# Patient Record
Sex: Male | Born: 1945 | Race: White | Hispanic: No | Marital: Married | Smoking: Never smoker
Health system: Southern US, Community
[De-identification: ages and names within clinical notes are randomized; demographics above are authoritative.]

## PROBLEM LIST (undated history)

## (undated) DIAGNOSIS — K219 Gastro-esophageal reflux disease without esophagitis: Secondary | ICD-10-CM

## (undated) DIAGNOSIS — J3089 Other allergic rhinitis: Secondary | ICD-10-CM

## (undated) DIAGNOSIS — T4145XA Adverse effect of unspecified anesthetic, initial encounter: Secondary | ICD-10-CM

## (undated) DIAGNOSIS — R112 Nausea with vomiting, unspecified: Secondary | ICD-10-CM

## (undated) DIAGNOSIS — F329 Major depressive disorder, single episode, unspecified: Secondary | ICD-10-CM

## (undated) DIAGNOSIS — R05 Cough: Secondary | ICD-10-CM

## (undated) DIAGNOSIS — Z87442 Personal history of urinary calculi: Secondary | ICD-10-CM

## (undated) DIAGNOSIS — E785 Hyperlipidemia, unspecified: Secondary | ICD-10-CM

## (undated) DIAGNOSIS — G4730 Sleep apnea, unspecified: Secondary | ICD-10-CM

## (undated) DIAGNOSIS — I459 Conduction disorder, unspecified: Secondary | ICD-10-CM

## (undated) DIAGNOSIS — F32A Depression, unspecified: Secondary | ICD-10-CM

## (undated) DIAGNOSIS — R059 Cough, unspecified: Secondary | ICD-10-CM

## (undated) DIAGNOSIS — K589 Irritable bowel syndrome without diarrhea: Secondary | ICD-10-CM

## (undated) DIAGNOSIS — F419 Anxiety disorder, unspecified: Secondary | ICD-10-CM

## (undated) DIAGNOSIS — M25569 Pain in unspecified knee: Secondary | ICD-10-CM

## (undated) DIAGNOSIS — R053 Chronic cough: Secondary | ICD-10-CM

## (undated) DIAGNOSIS — K851 Biliary acute pancreatitis without necrosis or infection: Secondary | ICD-10-CM

## (undated) DIAGNOSIS — M1990 Unspecified osteoarthritis, unspecified site: Secondary | ICD-10-CM

## (undated) DIAGNOSIS — Z9889 Other specified postprocedural states: Secondary | ICD-10-CM

## (undated) DIAGNOSIS — T8859XA Other complications of anesthesia, initial encounter: Secondary | ICD-10-CM

## (undated) DIAGNOSIS — C801 Malignant (primary) neoplasm, unspecified: Secondary | ICD-10-CM

## (undated) DIAGNOSIS — H9190 Unspecified hearing loss, unspecified ear: Secondary | ICD-10-CM

## (undated) HISTORY — PX: KIDNEY STONE SURGERY: SHX686

## (undated) HISTORY — PX: HERNIA REPAIR: SHX51

## (undated) HISTORY — PX: FOOT SURGERY: SHX648

## (undated) HISTORY — PX: HAMMER TOE SURGERY: SHX385

## (undated) HISTORY — PX: COLONOSCOPY: SHX174

## (undated) HISTORY — PX: KNEE ARTHROSCOPY: SHX127

## (undated) HISTORY — PX: MOHS SURGERY: SUR867

## (undated) HISTORY — PX: CHOLECYSTECTOMY: SHX55

## (undated) HISTORY — PX: EYE SURGERY: SHX253

## (undated) HISTORY — PX: TONSILLECTOMY: SUR1361

---

## 2005-01-11 ENCOUNTER — Ambulatory Visit: Payer: Self-pay | Admitting: General Practice

## 2005-12-19 ENCOUNTER — Ambulatory Visit: Payer: Self-pay | Admitting: General Practice

## 2006-01-04 ENCOUNTER — Ambulatory Visit: Payer: Self-pay | Admitting: General Practice

## 2007-02-13 DIAGNOSIS — K851 Biliary acute pancreatitis without necrosis or infection: Secondary | ICD-10-CM

## 2007-02-13 HISTORY — PX: JOINT REPLACEMENT: SHX530

## 2007-02-13 HISTORY — DX: Biliary acute pancreatitis without necrosis or infection: K85.10

## 2007-07-21 ENCOUNTER — Other Ambulatory Visit: Payer: Self-pay

## 2007-07-21 ENCOUNTER — Ambulatory Visit: Payer: Self-pay | Admitting: General Practice

## 2007-08-04 ENCOUNTER — Inpatient Hospital Stay: Payer: Self-pay | Admitting: General Practice

## 2007-08-12 ENCOUNTER — Other Ambulatory Visit: Payer: Self-pay

## 2007-08-12 ENCOUNTER — Emergency Department: Payer: Self-pay | Admitting: Emergency Medicine

## 2007-09-28 ENCOUNTER — Ambulatory Visit: Payer: Self-pay | Admitting: Family Medicine

## 2007-09-29 ENCOUNTER — Inpatient Hospital Stay: Payer: Self-pay | Admitting: Surgery

## 2007-09-29 ENCOUNTER — Other Ambulatory Visit: Payer: Self-pay

## 2009-04-25 ENCOUNTER — Ambulatory Visit: Payer: Self-pay

## 2009-05-26 ENCOUNTER — Ambulatory Visit: Payer: Self-pay | Admitting: General Practice

## 2009-06-10 ENCOUNTER — Ambulatory Visit: Payer: Self-pay | Admitting: General Practice

## 2010-01-26 ENCOUNTER — Ambulatory Visit: Payer: Self-pay | Admitting: Physician Assistant

## 2010-07-07 ENCOUNTER — Ambulatory Visit: Payer: Self-pay | Admitting: Internal Medicine

## 2011-01-01 ENCOUNTER — Ambulatory Visit: Payer: Self-pay | Admitting: Surgery

## 2011-01-08 ENCOUNTER — Ambulatory Visit: Payer: Self-pay | Admitting: Surgery

## 2011-11-07 ENCOUNTER — Ambulatory Visit: Payer: Self-pay | Admitting: Gastroenterology

## 2012-07-03 ENCOUNTER — Ambulatory Visit: Payer: Self-pay | Admitting: Surgery

## 2012-07-03 LAB — BASIC METABOLIC PANEL
Anion Gap: 5 — ABNORMAL LOW (ref 7–16)
Calcium, Total: 8.9 mg/dL (ref 8.5–10.1)
Co2: 27 mmol/L (ref 21–32)
EGFR (African American): >60
EGFR (Non-African Amer.): >60
Glucose: 103 mg/dL — ABNORMAL HIGH (ref 65–99)
Potassium: 3.7 mmol/L (ref 3.5–5.1)

## 2012-07-03 LAB — CBC WITH DIFFERENTIAL/PLATELET
Basophil #: 0.1 10*3/uL (ref 0.0–0.1)
Basophil %: 1.1 %
Eosinophil #: 0.3 10*3/uL (ref 0.0–0.7)
HCT: 40.8 % (ref 40.0–52.0)
HGB: 14.3 g/dL (ref 13.0–18.0)
Lymphocyte %: 16 %
MCH: 33.7 pg (ref 26.0–34.0)
MCHC: 35 g/dL (ref 32.0–36.0)
Monocyte %: 6.9 %
Neutrophil #: 4.7 10*3/uL (ref 1.4–6.5)
Neutrophil %: 71.6 %
Platelet: 213 10*3/uL (ref 150–440)
RBC: 4.24 10*6/uL — ABNORMAL LOW (ref 4.40–5.90)
RDW: 14 % (ref 11.5–14.5)

## 2012-07-10 ENCOUNTER — Ambulatory Visit: Payer: Self-pay | Admitting: Surgery

## 2012-07-11 LAB — CBC WITH DIFFERENTIAL/PLATELET
Basophil #: 0 10*3/uL (ref 0.0–0.1)
Eosinophil #: 0.3 10*3/uL (ref 0.0–0.7)
HGB: 13.2 g/dL (ref 13.0–18.0)
MCHC: 35.1 g/dL (ref 32.0–36.0)
MCV: 97 fL (ref 80–100)
Neutrophil %: 84 %
RBC: 3.87 10*6/uL — ABNORMAL LOW (ref 4.40–5.90)
RDW: 13.7 % (ref 11.5–14.5)
WBC: 11.6 10*3/uL — ABNORMAL HIGH (ref 3.8–10.6)

## 2012-09-03 ENCOUNTER — Other Ambulatory Visit: Payer: Self-pay | Admitting: Gastroenterology

## 2012-09-10 ENCOUNTER — Other Ambulatory Visit: Payer: Self-pay | Admitting: Gastroenterology

## 2012-09-15 ENCOUNTER — Ambulatory Visit: Payer: Self-pay | Admitting: Internal Medicine

## 2012-09-22 ENCOUNTER — Ambulatory Visit: Payer: Self-pay | Admitting: Gastroenterology

## 2012-11-06 ENCOUNTER — Ambulatory Visit: Payer: Self-pay | Admitting: Gastroenterology

## 2012-11-07 LAB — PATHOLOGY REPORT

## 2013-02-12 HISTORY — PX: HERNIA REPAIR: SHX51

## 2013-05-04 ENCOUNTER — Ambulatory Visit: Payer: Self-pay | Admitting: Urology

## 2013-06-25 DIAGNOSIS — E785 Hyperlipidemia, unspecified: Secondary | ICD-10-CM | POA: Insufficient documentation

## 2013-09-28 IMAGING — US US RENAL KIDNEY
1 series · 14 of 17 positions shown · non-contrast
Comparison: none

REASON FOR EXAM: flank back pain
COMMENTS:

[Series 1: us renal kidney · 0.31mm/px · 14 of 17 slices shown]
[im 1/17]
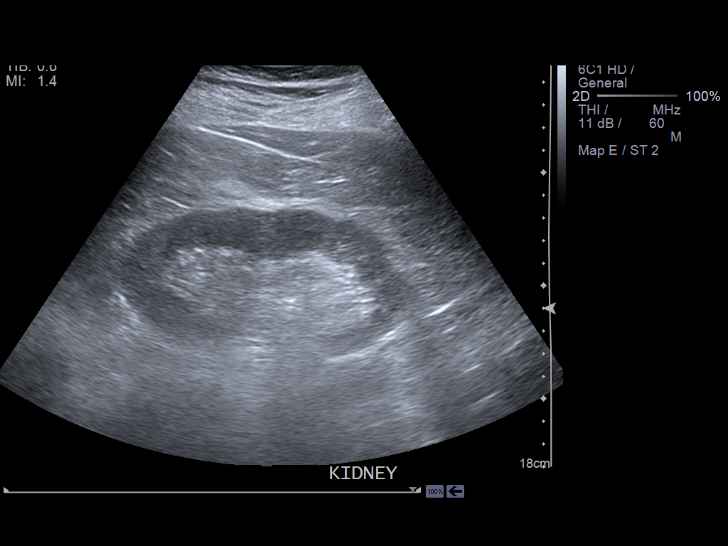
[im 2/17]
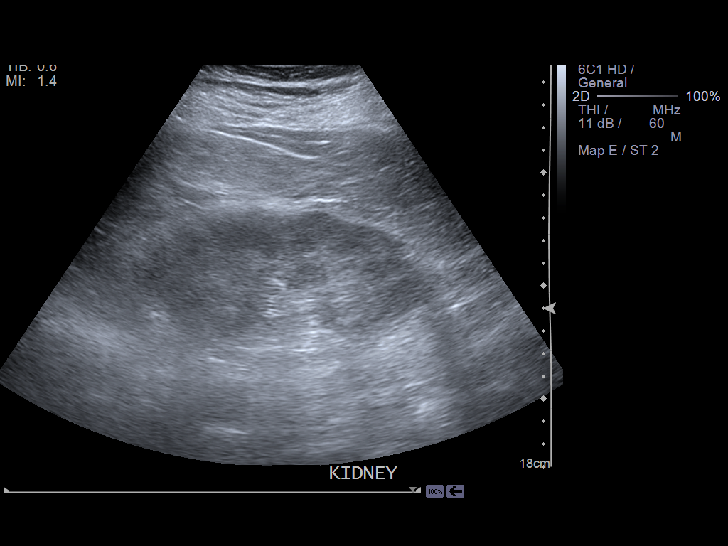
[im 4/17]
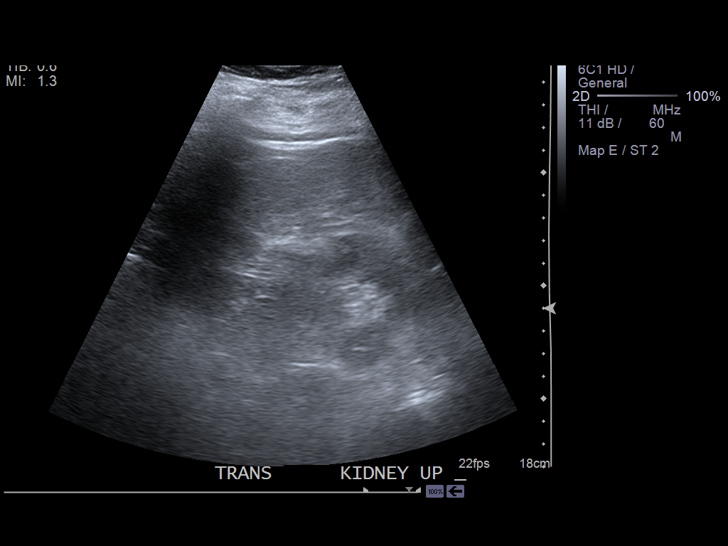
[im 5/17]
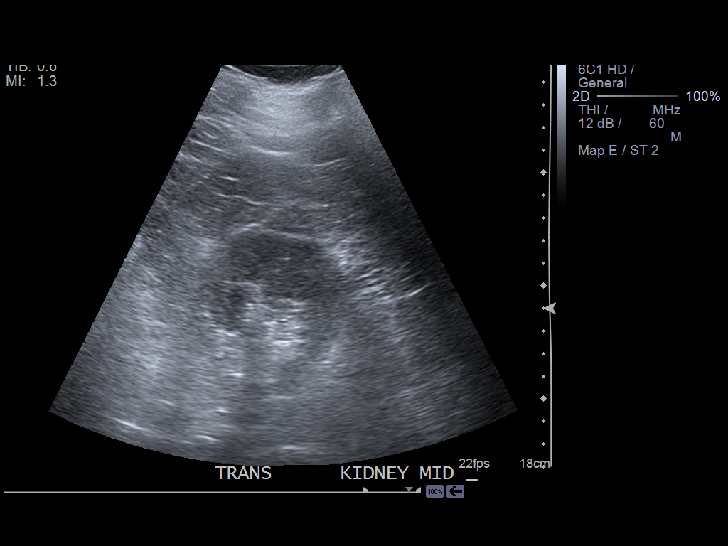
[im 6/17]
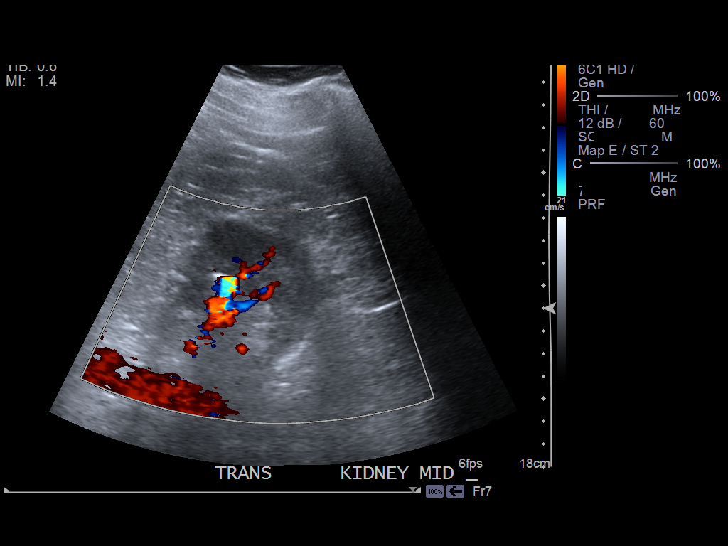
[im 7/17]
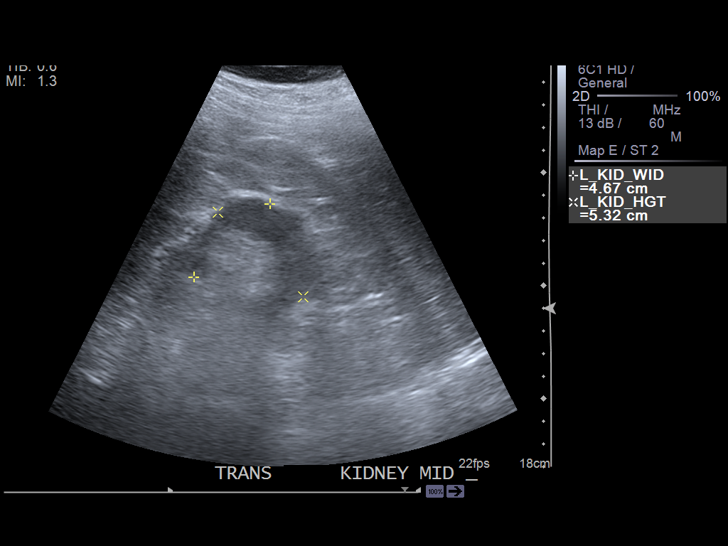
[im 8/17]
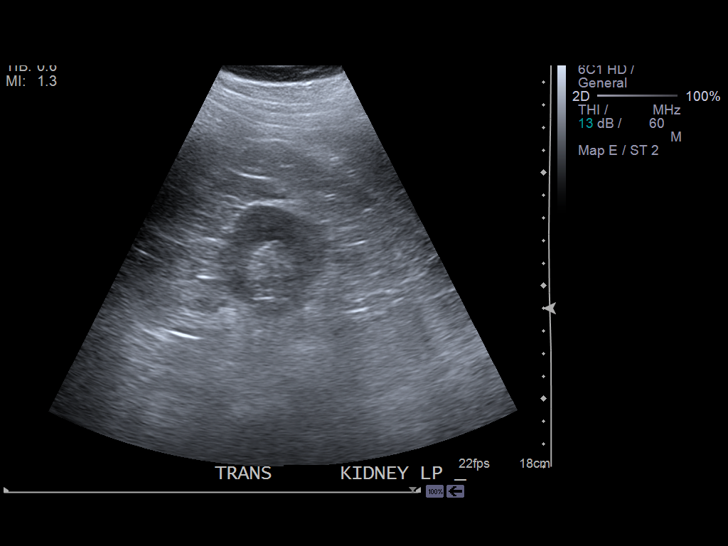
[im 10/17]
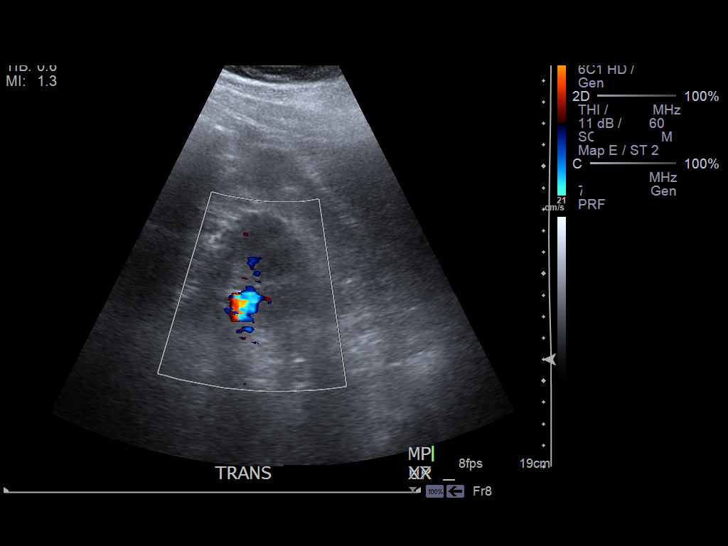
[im 11/17]
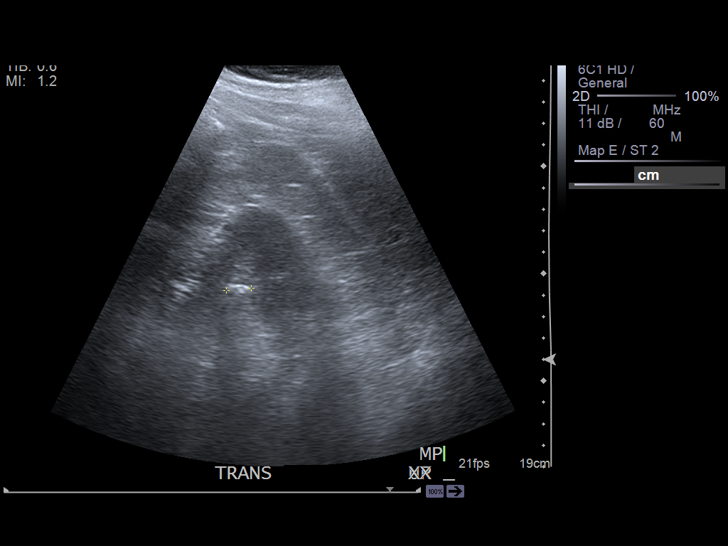
[im 12/17]
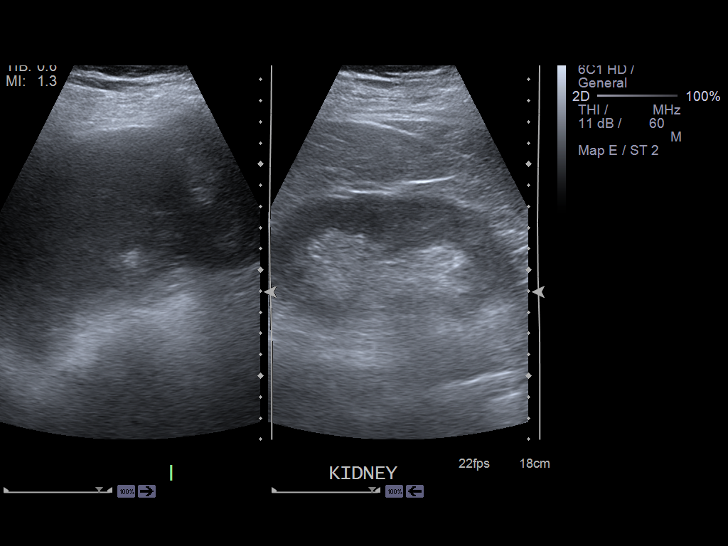
[im 13/17]
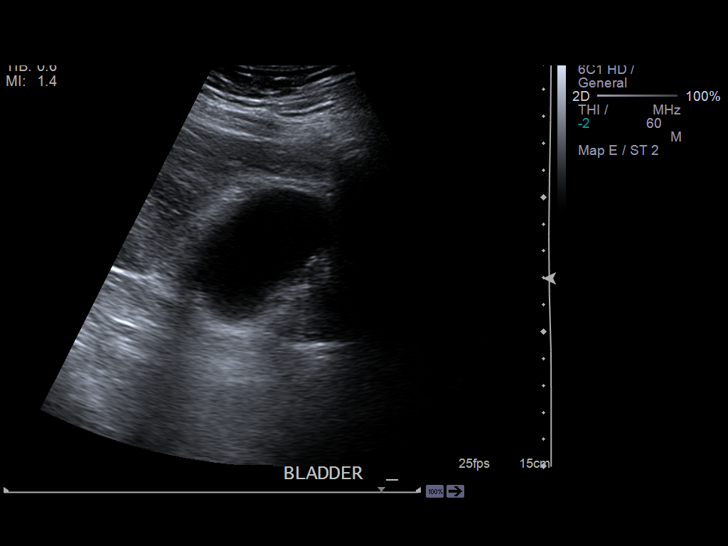
[im 14/17]
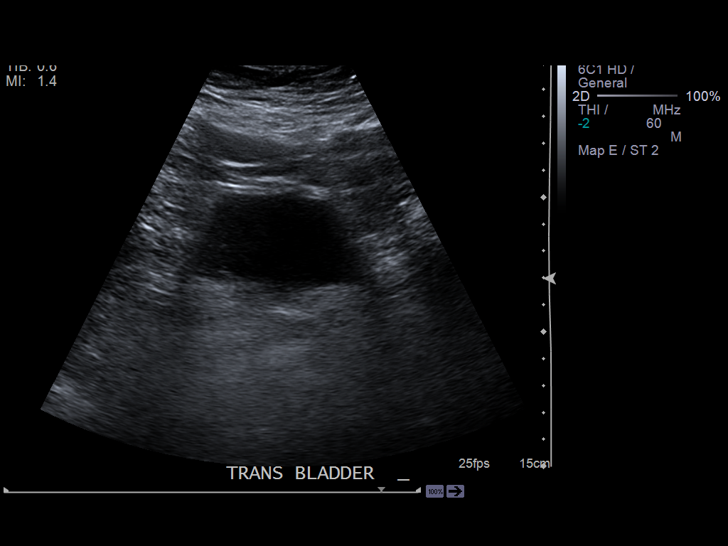
[im 16/17]
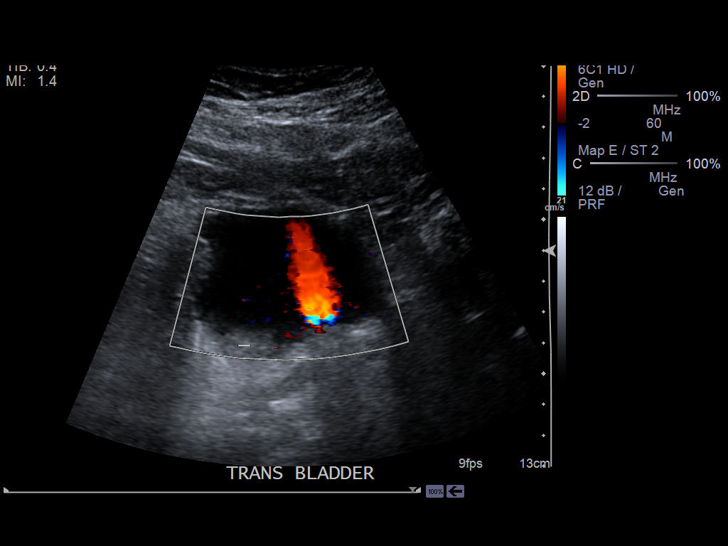
[im 17/17]
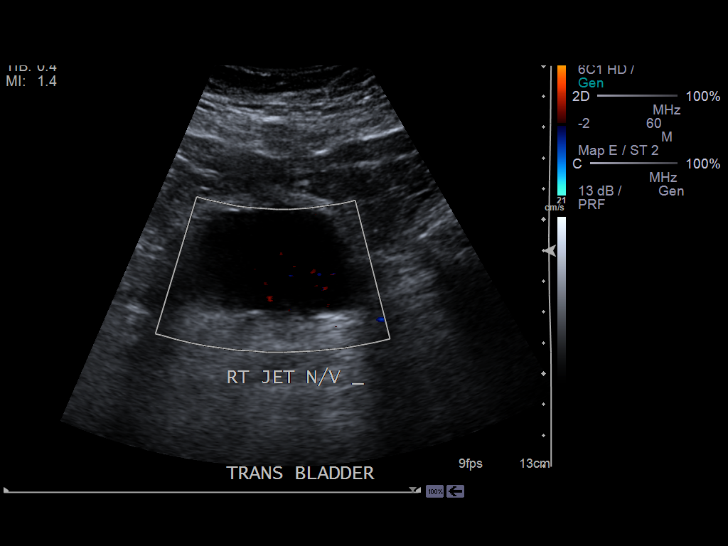

[14 of 17 positions shown; findings below may reference images not displayed]

PROCEDURE:     TYURINA - TYURINA KIDNEYS  - September 15, 2012  [DATE]

RESULT:     Renal sonogram demonstrates the left kidney measures 12.93 x
4.67 x 5.3 to centimeters with a 11.6 mm echogenic shadowing structure in
the mid left kidney consistent with a stone. There is no hydronephrosis or
hilar ureter evident. Left ureteral jet is demonstrated. The right ureteral
jet could not be visualized. There is a small volume urine in the bladder.
The right kidney measures 12.98 x 5.74 x 5.46 cm.
IMPRESSION: Nonobstructing left renal stone. No hydronephrosis. No
solid or cystic masses. The right ureteral jet could not be visualized the
bladder. Correlate clinically.

[REDACTED]

## 2013-09-28 IMAGING — US US EXTREM LOW VENOUS*L*
1 series · 13 of 24 positions shown · non-contrast
Comparison: none

REASON FOR EXAM: calf pain  eval DVT   CALL REPORT  [DATE]
COMMENTS:

[Series 1: us extrem low venous*left* · 0.11mm/px · 13 of 47 slices shown]
[im 1/47]
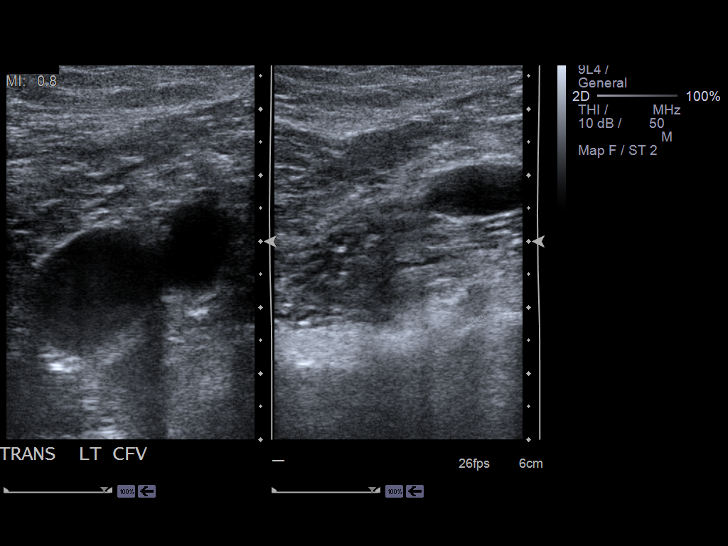
[im 5/47]
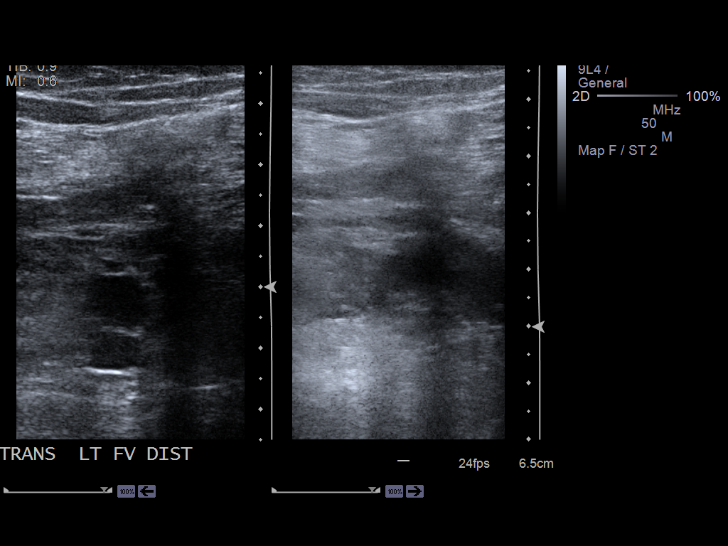
[im 9/47]
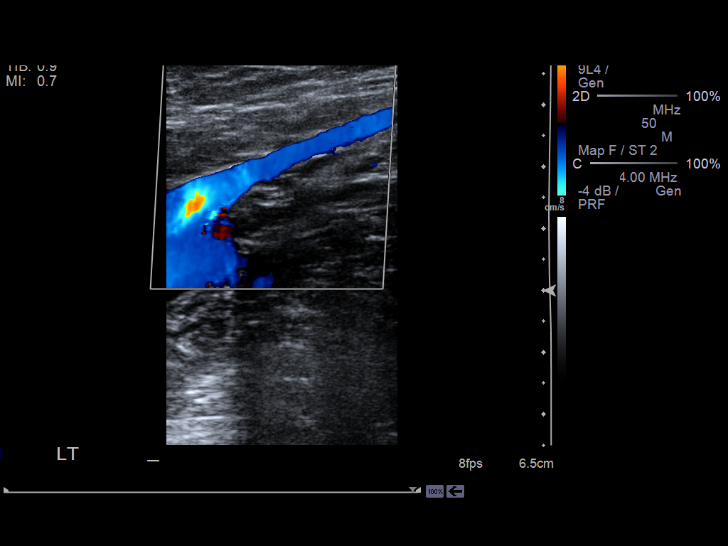
[im 13/47]
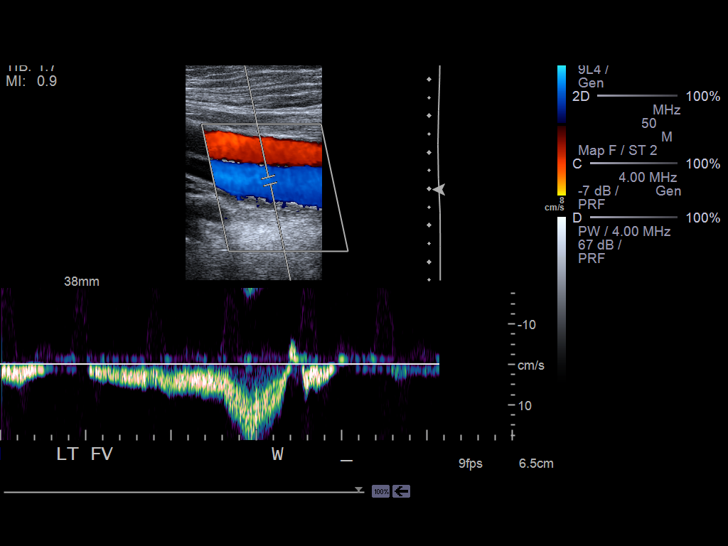
[im 17/47]
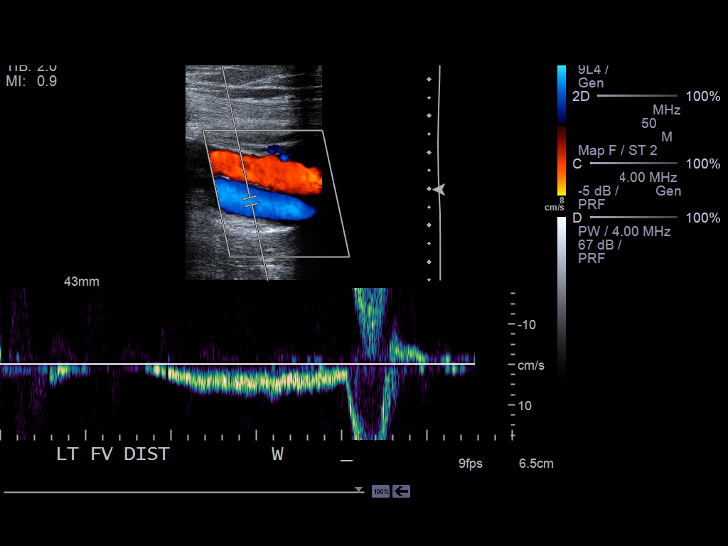
[im 21/47]
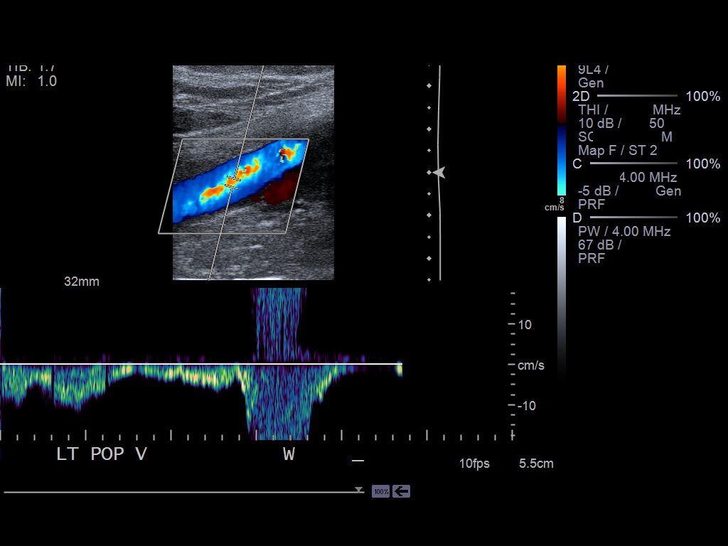
[im 25/47]
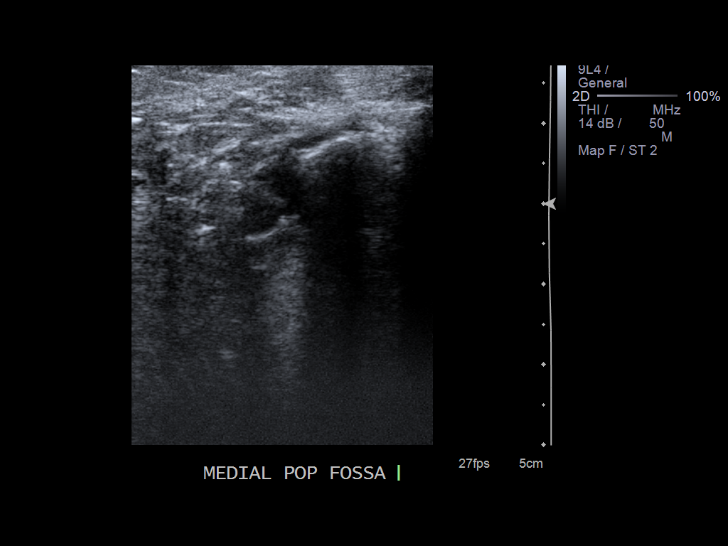
[im 27/47]
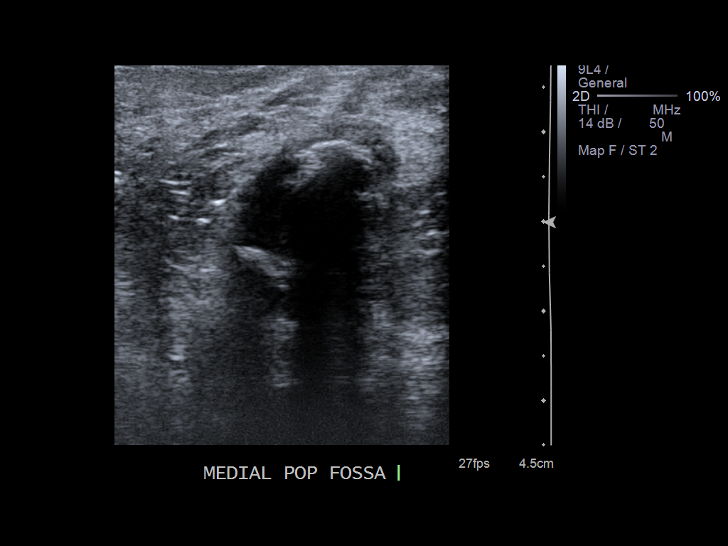
[im 31/47]
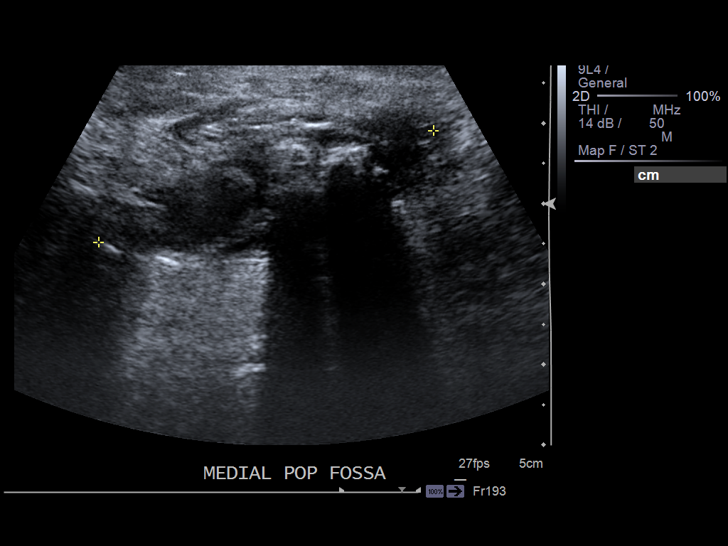
[im 35/47]
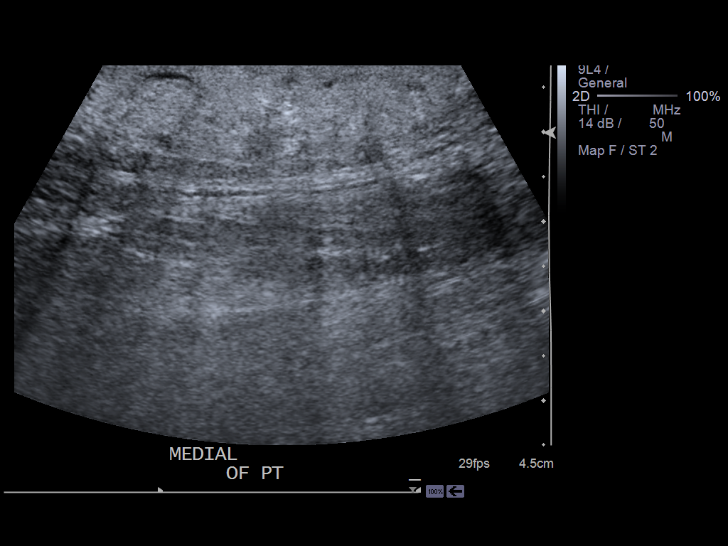
[im 39/47]
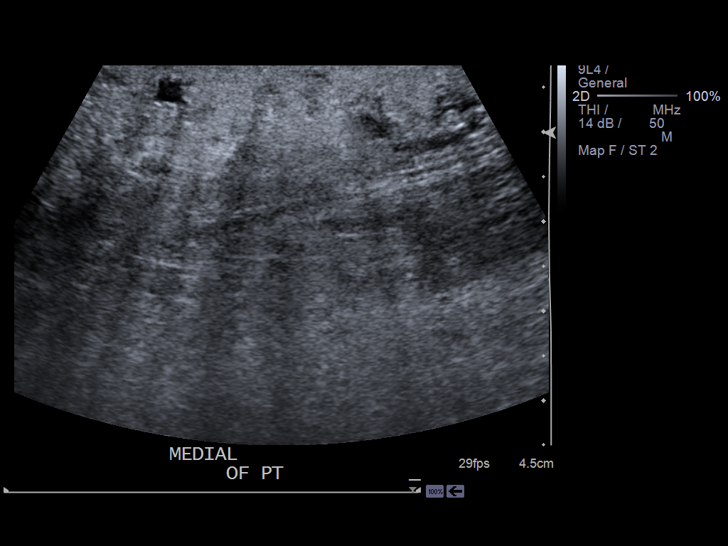
[im 43/47]
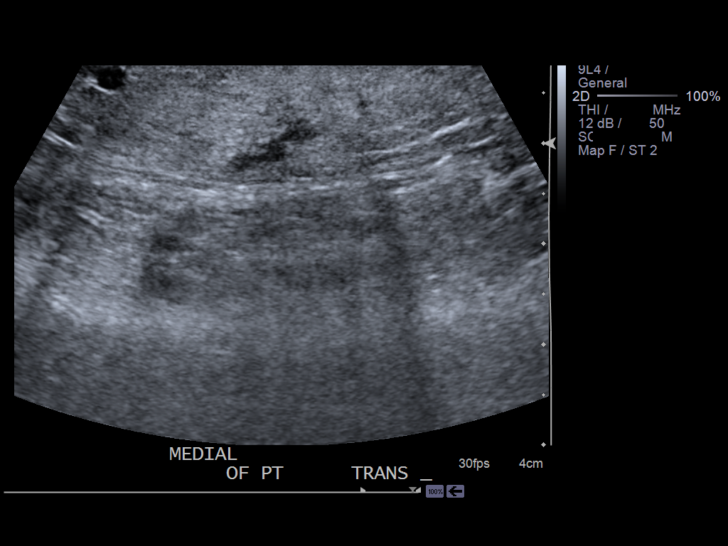
[im 47/47]
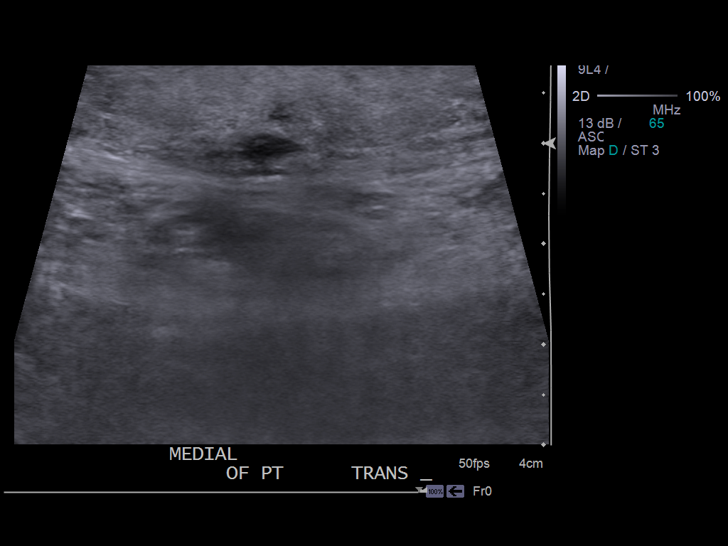

[13 of 24 positions shown; findings below may reference images not displayed]

PROCEDURE:     AMAJUSTE - AMAJUSTE DOPPLER VEINS LT LEG EXTR  - September 15, 2012  [DATE]

RESULT:     Carotid Doppler interrogation of the deep venous system of the
left leg is performed from the common femoral vein through the popliteal
vein. The patient has no previous exam for comparison. There is complete
compressibility of the deep venous structures on the grayscale compression
imaging portion of the exam. The color Doppler and spectral Doppler
appearance is normal. There appears to be an irregularly marginated small
popliteal fossa cyst measuring 2.16 x 1.04 x 4.39 cm. The area of the
patient's palpable abnormality demonstrates a heterogeneous echogenic tissue
appearance superficially which could represent a lipoma. The hematoma is
felt to be less likely.
IMPRESSION: 1. No evidence of left lower extremity deep vein thrombosis.
2. Small popliteal fossa cyst.
3. Uncertain increased echotexture at the site of the patient's palpable
abnormality measuring up to 5.16 x 3.24 cm. This could represent a somewhat
heterogeneous appearance of a lipoma. Correlate clinically.

[REDACTED]

## 2013-09-30 ENCOUNTER — Ambulatory Visit: Payer: Self-pay | Admitting: Internal Medicine

## 2013-10-13 ENCOUNTER — Ambulatory Visit: Payer: Self-pay | Admitting: Urology

## 2013-10-22 ENCOUNTER — Ambulatory Visit: Payer: Self-pay | Admitting: Urology

## 2014-03-15 ENCOUNTER — Ambulatory Visit: Payer: Self-pay | Admitting: Urology

## 2014-03-15 LAB — CBC WITH DIFFERENTIAL/PLATELET
BASOS ABS: 0.1 10*3/uL (ref 0.0–0.1)
BASOS PCT: 1.3 %
EOS ABS: 0.2 10*3/uL (ref 0.0–0.7)
Eosinophil %: 2.7 %
HCT: 42.5 % (ref 40.0–52.0)
HGB: 14.4 g/dL (ref 13.0–18.0)
LYMPHS PCT: 22.3 %
Lymphocyte #: 1.7 10*3/uL (ref 1.0–3.6)
MCH: 32.4 pg (ref 26.0–34.0)
MCHC: 33.9 g/dL (ref 32.0–36.0)
MCV: 95 fL (ref 80–100)
MONO ABS: 0.6 x10 3/mm (ref 0.2–1.0)
Monocyte %: 8.4 %
Neutrophil #: 5 10*3/uL (ref 1.4–6.5)
Neutrophil %: 65.3 %
PLATELETS: 239 10*3/uL (ref 150–440)
RBC: 4.46 10*6/uL (ref 4.40–5.90)
RDW: 13.4 % (ref 11.5–14.5)
WBC: 7.6 10*3/uL (ref 3.8–10.6)

## 2014-03-22 ENCOUNTER — Ambulatory Visit: Payer: Self-pay | Admitting: Urology

## 2014-04-29 ENCOUNTER — Emergency Department: Payer: Self-pay | Admitting: Emergency Medicine

## 2014-05-03 ENCOUNTER — Ambulatory Visit: Payer: Self-pay | Admitting: Urology

## 2014-06-04 NOTE — Op Note (Signed)
PATIENT NAME:  Travis Mills, Travis Mills MR#:  [id number] DATE OF BIRTH:  10/15/1945  DATE OF PROCEDURE:  07/10/2012  PREOPERATIVE DIAGNOSIS:  Ventral hernia.   POSTOPERATIVE DIAGNOSIS:  Ventral hernia.   OPERATION:  Ventral hernia repair.   ANESTHESIA:  General.   SURGEON:  Rodena Goldmann, M.D.   OPERATIVE PROCEDURE:  With the patient in the supine position after induction of appropriate general anesthesia, patient's abdomen was prepped with ChloraPrep and draped with sterile towels.  Alcohol wipe, Betadine impregnated Steri-Drape were utilized.  The original plan was a laparoscopic robotically-assisted ventral hernia repair.  A 5 mm left upper quadrant Optiview incision was made with the 5 mm scope in the abdominal cavity without difficulty.  An assistant port 12 mm size was placed laterally and inferiorly an 8 mm port was placed.  The upper port was then switched for an 8 mm port under direct vision.  The robot was brought to the table and docked.  The abdomen was easily insufflated without difficulty.  The hernia examined.  A small adhesion noted at the defect.  The defect was quite small.  However, during the insufflation and pneumoperitoneum the patient developed sudden onset of complete heart block.  We desufflated the abdomen.  His heart returned with medical support.  Putting him back at lower levels of pneumoperitoneum did not allow for adequate visualization with the robot of the midline hernia.  I elected to abandon the robotic approach and move to an open repair despite the fact the defect was quite small.  Midline incision was made in the area of the hernia, carried down through the subcutaneous tissue with Bovie electrocautery.  The hernia defect was identified and opened into the abdomen.  The adhesion to the anterior abdominal wall was lysed.  The defect was enlarged, allowed enough to accommodate a piece of circular Ventralex which was sutured in place with 0 Surgilon.  The wings were trimmed.   The fascial defect was closed with horizontal mattress sutures of 0 Maxon.  The area was infiltrated with 0.25% Marcaine for postoperative pain control.  Skin was clipped as were the lateral incisions and the patient awakened after a sterile dressing was placed.  The patient had no further episodes of complete heart block.  The patient was taken to the recovery room in satisfactory condition.     ____________________________ Micheline Maze, MD rle:ea D: 07/10/2012 14:35:15 ET T: 07/11/2012 00:05:09 ET JOB#: [id number]  cc: Micheline Maze, MD, <Dictator> Isaias Cowman, MD Leonie Douglas. Doy Hutching, MD Rodena Goldmann MD ELECTRONICALLY SIGNED 07/12/2012 9:45

## 2014-06-04 NOTE — Discharge Summary (Signed)
PATIENT NAME:  Travis Mills, Travis Mills MR#:  [id number] DATE OF BIRTH:  03-27-1945  DATE OF ADMISSION:  07/10/2012 DATE OF DISCHARGE:  07/11/2012  BRIEF HISTORY:  The patient is a 69 year old gentleman admitted for an elective surgical procedure on his small midline ventral hernia. We had planned robotic-assisted ventral hernia repair. At the time of surgery he was stable, intubated and the ports placed without difficulty. Upon insufflating his abdomen, he developed complete heart block with escape rates in the 30s. The abdomen was desufflated and his rhythm returned to normal some pharmacologic support. We then elected to abandon the laparoscopic approach and moved to an open procedure which was much shorter and it was accomplished without difficulty. He was admitted for observation using telemetry and having Dr. Miquel Dunn of the Cardiology service assist in his management. He has done well over the course of the evening. He has no significant problems. His wounds look good. There is no sign of any infection. He has got some moderate drainage. His heart rhythms remained stable over the course of the evening. Dr. Saralyn Pilar does not feel any further workup for his heart is indicated, he has had no further dysrhythmia. We will discharge him today to be followed in the office in 7 to 10 days' time. Bathing, activity, and driving instructions were given to the patient. He is to resume his medication including aspirin 81 mg p.o. daily, pravastatin 20 mg p.o. at bedtime, lithium 300 mg p.o. q.a.m. and 750 mg p.o. at bedtime, sertraline 100 mg at bedtime, Cialis 5 mg once a day, Nexium 40 mg once a day and Vicodin 5/325 every 6 hours p.r.n. pain.   FINAL DISCHARGE DIAGNOSIS:  Ventral hernia, complete heart block.   SURGERY:  Ventral hernia repair.   ____________________________ Micheline Maze, MD rle:jm D: 07/11/2012 09:25:28 ET T: 07/11/2012 11:43:18 ET JOB#: [id number]  cc: Micheline Maze, MD,  <Dictator> Isaias Cowman, MD Leonie Douglas. Doy Hutching, MD Rodena Goldmann MD ELECTRONICALLY SIGNED 07/12/2012 9:45

## 2014-06-04 NOTE — Consult Note (Signed)
PATIENT NAME:  Travis Mills, Travis Mills MR#:  [id number] DATE OF BIRTH:  1945-02-16  DATE OF CONSULTATION:  07/10/2012  CONSULTING PHYSICIAN:  Isaias Cowman, MD  PRIMARY CARE PHYSICIAN:  Dr. Doy Hutching  REFERRING PHYSICIAN:  Dr. Pat Patrick   REASON FOR CONSULTATION: Bradycardia.   HISTORY OF PRESENT ILLNESS: The patient is a 69 year old gentleman with history of hypertension, hyperlipidemia and sleep apnea. The patient underwent robotic-assisted laparoscopic incisional hernia repair today. During CO2 insufflation, the patient experienced bradycardia. Telemetry revealed probable junctional bradycardia, with a rate in the 40s. When the patient was decompressed, bradycardia resolved. The patient remained clinically stable in recovery, and has remained in sinus rhythm, without evidence of bradycardia. The patient denies prior history of bradycardia. He has had previous cardiac catheterization, which revealed insignificant coronary artery disease, and currently denies any chest pain, shortness of breath, presyncope or syncope.   PAST MEDICAL HISTORY: 1.  Insignificant coronary artery disease by cardiac catheterization in 1997.  2.  Hypertension.  3.  Hyperlipidemia.  4.  Sleep apnea. 5.  Erectile dysfunction.   MEDICATIONS:  Pravastatin 20 mg daily, Cialis, lithium carbonate, Nexium, Norco and sertraline.   SOCIAL HISTORY:  The patient is married. Denies tobacco abuse.   FAMILY HISTORY: No immediate family history for coronary artery disease or myocardial infarction.   REVIEW OF SYSTEMS:   CONSTITUTIONAL: No fever or chills.  EYES:  No blurry vision.  EARS:  No hearing loss.  RESPIRATORY:  No shortness of breath.  CARDIOVASCULAR:  No chest pain.  GASTROINTESTINAL: The patient has had some recent diarrhea.  GENITOURINARY: No dysuria or hematuria.  ENDOCRINE:  No polyuria or polydipsia.  MUSCULOSKELETAL: No arthralgias or myalgias.  NEUROLOGICAL:  No focal muscle weakness or numbness.  PSYCHOLOGICAL:  No depression or anxiety.   PHYSICAL EXAMINATION: VITAL SIGNS:  Blood pressure 98/63, pulse 75, respirations 18, temperature 97, pulse ox 92%.  HEENT:  Pupils equal and reactive to light and accommodation.  NECK:  Supple, without thyromegaly.  LUNGS:  Clear.  CARDIOVASCULAR:  Normal JVP. Normal PMI. Regular rate and rhythm. Normal S1, S2. No appreciable gallop, murmur or rub.  ABDOMEN: Soft, nontender. Pulses were intact bilaterally.  MUSCULOSKELETAL: Normal muscle tone.  NEUROLOGIC: The patient is alert and oriented x 3. Motor and sensory both grossly intact.   IMPRESSION: A 69 year old gentleman with brief bradycardia during laparoscopic incisional hernia repair today, with resolution. The patient currently is asymptomatic.   RECOMMENDATIONS: 1.  Agree with overall current therapy.  2.  Continue to monitor telemetry. 3. If patient does well overnight, may discharge home without further cardiac evaluation at this time.  4.  Would defer temporary or permanent pacemaker implantation at this time.     ____________________________ Isaias Cowman, MD ap:mr D: 07/10/2012 18:57:00 ET T: 07/10/2012 19:50:54 ET JOB#: [id number]  cc: Isaias Cowman, MD, <Dictator> Isaias Cowman MD ELECTRONICALLY SIGNED 07/21/2012 8:48

## 2014-06-13 NOTE — Op Note (Signed)
PATIENT NAME:  Travis Mills, Travis Mills MR#:  [id number] DATE OF BIRTH:  07/22/1945  DATE OF PROCEDURE:  03/22/2014  PREOPERATIVE DIAGNOSIS: Left 5 mm kidney stone.  POSTOPERATIVE DIAGNOSIS: Left 5 mm kidney stone.  PROCEDURE PERFORMED: Left ureteroscopy, laser lithotripsy, left ureteral stent placement.   ATTENDING SURGEON: Sherlynn Stalls, MD  ANESTHESIA: General anesthesia.  ESTIMATED BLOOD LOSS: Minimal.   DRAINS: A 6 x 26-French double-J ureteral stent on the left.   COMPLICATIONS: None.   SPECIMENS: None.   INDICATION: This is a 69 year old male with a history of kidney stones who has persistent left flank pain since September 2015. He did have a CT scan at [facility] around that time which demonstrates a 5 mm stone within the renal pelvis which presumably has been ball valving, causing his flank discomfort. He reports that he has not passed the stone and would like to have it treated. We discussed various treatment options and he elected to undergo left ureteroscopy, laser lithotripsy, left ureteral stent placement.   PROCEDURE: The patient was correctly identified in the preoperative holding area and informed consent was confirmed. He was brought to the operating suite and placed on the table in the supine position. At this time, universal timeout protocol was performed. All team members were identified. Venodyne boots were placed and he was administered 500 mg of IV Levaquin in the perioperative period. He was then placed under general anesthesia, repositioned lower on the bed in the dorsal lithotomy position, and prepped and draped in the standard surgical fashion. At this point in time, a rigid 22-French cystoscope was advanced per urethra into the bladder and attention was turned to the left ureteral orifice. This was cannulated using a 5-French open-ended ureteral catheter just within the UO and a retrograde pyelogram was performed. This revealed a delicate-appearing ureter without any filling  defects and no evidence of hydronephrosis. Prior to the retrograde, there was a small punctate calcification noted overlying the renal shadow consistent with his stone seen on CT scan. At this point in time, an 0.038 Sensor wire was advanced up to the level of the kidney and the cystoscope was removed. A dual-lumen catheter was then used to introduce a second wire up to the level of the renal pelvis without difficulty. One was used as a working wire and the other as a Chiropodist. The safety wire snapped in place. I then attempted to advance a 7-French flexible ureteroscope over the working wires to the level of the the renal pelvis but was unsuccessful, meeting resistance in the distal one-third of the ureter with some buckling of the scope. I then decided to place an access sheath to gently dilate the ureter first using the inner cannula of a 12/14-French access sheath, which did pass up to the level of the mid proximal ureter without difficulty. Both the inner and outer sheaths were then used up to the same level and the inner sheath was removed, as well as the working wire. The flexible ureteroscope was then advanced through this up to the level of the renal pelvis. An approximately 7 mm stone was identified in a mid pole calyx. A 273 micron laser fiber was then brought in using the settings of 0.2 joules and 50 Hz. The stone was fragmented into innumerable tiny pieces approximately the size of the laser fiber. These pieces at the end of the fragmentation were so small that they were unable to be basketed out effectively. A retrograde pyelogram was then performed through the  scope at the level of the UPJ, creating a map of the renal pelvis and calyces each individual calyx. Each individual calyx was then directly visualized to ensure that there were no residual significant stone fragments. This was negative. I then backed the ureteroscope down the ureter under direct visualization, removing the access sheath at  the same time, to ensure that there were no ureteral defects, stones, or concerns for perforation. The ureter also appeared to be completely intact. The safety wire was then backloaded over a rigid cystoscope and a 6 x 26-French double-J ureteral stent was placed over the wire up to the level of the kidney. The wire was partially withdrawn and a partial coil was noted. The wire was then fully withdrawn and a full coil was noted within the bladder. The bladder was then drained. The patient was then cleaned and dried and repositioned in the supine position, reversed from anesthesia, and taken to the PACU in stable condition. There were complications in this case.   ____________________________ Sherlynn Stalls, MD ajb:ST D: 03/22/2014 17:32:13 ET T: 03/23/2014 00:51:09 ET JOB#: [id number]  cc: Sherlynn Stalls, MD, <Dictator> Sherlynn Stalls MD ELECTRONICALLY SIGNED 04/06/2014 12:36

## 2014-09-24 ENCOUNTER — Other Ambulatory Visit: Payer: Self-pay | Admitting: Internal Medicine

## 2014-09-24 DIAGNOSIS — M79661 Pain in right lower leg: Secondary | ICD-10-CM

## 2014-09-27 ENCOUNTER — Other Ambulatory Visit: Payer: Self-pay

## 2014-09-27 DIAGNOSIS — N200 Calculus of kidney: Secondary | ICD-10-CM

## 2014-09-30 ENCOUNTER — Ambulatory Visit
Admission: RE | Admit: 2014-09-30 | Discharge: 2014-09-30 | Disposition: A | Discharge status: Home / Self Care | Payer: Medicare Other | Source: Ambulatory Visit | Attending: Internal Medicine | Admitting: Internal Medicine

## 2014-09-30 DIAGNOSIS — M79661 Pain in right lower leg: Secondary | ICD-10-CM | POA: Diagnosis not present

## 2015-05-04 ENCOUNTER — Telehealth: Payer: Self-pay | Admitting: Radiology

## 2015-05-04 NOTE — Telephone Encounter (Signed)
Pt called to cancel his appt on 4/5 with Larene Beach because he is having knee replacement surgery.  He states he will call back to r/s after he recuperates.

## 2015-05-18 ENCOUNTER — Ambulatory Visit: Payer: Self-pay | Admitting: Urology

## 2015-05-27 ENCOUNTER — Other Ambulatory Visit: Payer: Self-pay | Admitting: Orthopedic Surgery

## 2015-05-30 ENCOUNTER — Other Ambulatory Visit: Payer: Self-pay | Admitting: Orthopedic Surgery

## 2015-05-30 ENCOUNTER — Encounter
Admission: RE | Admit: 2015-05-30 | Discharge: 2015-05-30 | Disposition: A | Discharge status: Home / Self Care | Payer: Medicare Other | Source: Ambulatory Visit | Attending: Orthopedic Surgery | Admitting: Orthopedic Surgery

## 2015-05-30 DIAGNOSIS — Z01810 Encounter for preprocedural cardiovascular examination: Secondary | ICD-10-CM | POA: Diagnosis present

## 2015-05-30 DIAGNOSIS — I10 Essential (primary) hypertension: Secondary | ICD-10-CM | POA: Diagnosis not present

## 2015-05-30 DIAGNOSIS — Z01812 Encounter for preprocedural laboratory examination: Secondary | ICD-10-CM | POA: Diagnosis not present

## 2015-05-30 DIAGNOSIS — E785 Hyperlipidemia, unspecified: Secondary | ICD-10-CM | POA: Insufficient documentation

## 2015-05-30 HISTORY — DX: Nausea with vomiting, unspecified: R11.2

## 2015-05-30 HISTORY — DX: Malignant (primary) neoplasm, unspecified: C80.1

## 2015-05-30 HISTORY — DX: Adverse effect of unspecified anesthetic, initial encounter: T41.45XA

## 2015-05-30 HISTORY — DX: Hyperlipidemia, unspecified: E78.5

## 2015-05-30 HISTORY — DX: Gastro-esophageal reflux disease without esophagitis: K21.9

## 2015-05-30 HISTORY — DX: Anxiety disorder, unspecified: F41.9

## 2015-05-30 HISTORY — DX: Other specified postprocedural states: Z98.890

## 2015-05-30 HISTORY — DX: Conduction disorder, unspecified: I45.9

## 2015-05-30 HISTORY — DX: Irritable bowel syndrome without diarrhea: K58.9

## 2015-05-30 HISTORY — DX: Other complications of anesthesia, initial encounter: T88.59XA

## 2015-05-30 LAB — SURGICAL PCR SCREEN
MRSA, PCR: NEGATIVE
Staphylococcus aureus: NEGATIVE

## 2015-05-30 LAB — BASIC METABOLIC PANEL
Anion gap: 3 — ABNORMAL LOW (ref 5–15)
BUN: 21 mg/dL — ABNORMAL HIGH (ref 6–20)
CHLORIDE: 110 mmol/L (ref 101–111)
CO2: 25 mmol/L (ref 22–32)
CREATININE: 0.73 mg/dL (ref 0.61–1.24)
Calcium: 9.1 mg/dL (ref 8.9–10.3)
GFR calc non Af Amer: >60 mL/min (ref >60)
Glucose, Bld: 95 mg/dL (ref 65–99)
Potassium: 3.9 mmol/L (ref 3.5–5.1)
SODIUM: 138 mmol/L (ref 135–145)

## 2015-05-30 LAB — URINALYSIS COMPLETE WITH MICROSCOPIC (ARMC ONLY)
BILIRUBIN URINE: NEGATIVE
Bacteria, UA: NONE SEEN
GLUCOSE, UA: NEGATIVE mg/dL
Hgb urine dipstick: NEGATIVE
KETONES UR: NEGATIVE mg/dL
Leukocytes, UA: NEGATIVE
NITRITE: NEGATIVE
PROTEIN: NEGATIVE mg/dL
SPECIFIC GRAVITY, URINE: 1.019 (ref 1.005–1.030)
Squamous Epithelial / LPF: NONE SEEN
pH: 7 (ref 5.0–8.0)

## 2015-05-30 LAB — CBC
HEMATOCRIT: 42.2 % (ref 40.0–52.0)
Hemoglobin: 14.6 g/dL (ref 13.0–18.0)
MCH: 33.1 pg (ref 26.0–34.0)
MCHC: 34.6 g/dL (ref 32.0–36.0)
MCV: 95.7 fL (ref 80.0–100.0)
PLATELETS: 210 10*3/uL (ref 150–440)
RBC: 4.41 MIL/uL (ref 4.40–5.90)
RDW: 13.6 % (ref 11.5–14.5)
WBC: 6.8 10*3/uL (ref 3.8–10.6)

## 2015-05-30 LAB — PROTIME-INR
INR: 1.03
PROTHROMBIN TIME: 13.7 s (ref 11.4–15.0)

## 2015-05-30 LAB — SEDIMENTATION RATE: Sed Rate: 7 mm/hr (ref 0–20)

## 2015-05-30 LAB — APTT: APTT: 26 s (ref 24–36)

## 2015-05-30 NOTE — Patient Instructions (Signed)
Your procedure is scheduled on: Wednesday 06/15/15 Report to Day Surgery. 2ND FLOOR MEDICAL MALL ENTRANCE To find out your arrival time please call [phone] between 1PM - 3PM on Tuesday 06/14/15.  Remember: Instructions that are not followed completely may result in serious medical risk, up to and including death, or upon the discretion of your surgeon and anesthesiologist your surgery may need to be rescheduled.    __X__ 1. Do not eat food or drink liquids after midnight. No gum chewing or hard candies.     __X__ 2. No Alcohol for 24 hours before or after surgery.   ____ 3. Bring all medications with you on the day of surgery if instructed.    __X__ 4. Notify your doctor if there is any change in your medical condition     (cold, fever, infections).     Do not wear jewelry, make-up, hairpins, clips or nail polish.  Do not wear lotions, powders, or perfumes.   Do not shave 48 hours prior to surgery. Men may shave face and neck.  Do not bring valuables to the hospital.    [facility] is not responsible for any belongings or valuables.               Contacts, dentures or bridgework may not be worn into surgery.  Leave your suitcase in the car. After surgery it may be brought to your room.  For patients admitted to the hospital, discharge time is determined by your                treatment team.   Patients discharged the day of surgery will not be allowed to drive home.   Please read over the following fact sheets that you were given:   MRSA Information and Surgical Site Infection Prevention   __X__ Take these medicines the morning of surgery with A SIP OF WATER:    1. WELCHOL  2.   3.   4.  5.  6.  ____ Fleet Enema (as directed)   __X__ Use CHG Soap as directed  ____ Use inhalers on the day of surgery  ____ Stop metformin 2 days prior to surgery    ____ Take 1/2 of usual insulin dose the night before surgery and none on the morning of surgery.   __X__ Stop  Coumadin/Plavix/aspirin on 10 DAYS PRIOR TO SURGERY  __X__ Stop Anti-inflammatories on 7 DAYS PRIOR TO SURGERY (IBUPROFEN) YOU MAY TAKE TYLENOL FOR ACHES OR PAINS   ____ Stop supplements until after surgery.    __X__ Bring C-Pap to the hospital.

## 2015-06-01 LAB — URINE CULTURE: Culture: 1000 — AB

## 2015-06-02 NOTE — Pre-Procedure Instructions (Signed)
FAXED URINE AND URINE CULTURE RESULTS TO DR Marry Guan OFFICE

## 2015-06-02 NOTE — Pre-Procedure Instructions (Addendum)
FAX RETURNED FROM DR HOOTEN OFFICE WITH ORDERS VO: NO TREATMENT NECESSARY (urine culture results) ERUSSELL RN/DR HOOTEN

## 2015-06-03 MED ORDER — TRANEXAMIC ACID 1000 MG/10ML IV SOLN: 1000.0000 mg | INTRAVENOUS | Status: DC

## 2015-06-07 ENCOUNTER — Encounter
Admission: RE | Admit: 2015-06-07 | Discharge: 2015-06-07 | Disposition: A | Discharge status: Home / Self Care | Payer: Medicare Other | Source: Ambulatory Visit | Attending: Orthopedic Surgery | Admitting: Orthopedic Surgery

## 2015-06-07 DIAGNOSIS — Z01812 Encounter for preprocedural laboratory examination: Secondary | ICD-10-CM | POA: Diagnosis not present

## 2015-06-07 LAB — TYPE AND SCREEN
ABO/RH(D): A POS
ANTIBODY SCREEN: NEGATIVE

## 2015-06-07 LAB — ABO/RH: ABO/RH(D): A POS

## 2015-06-09 NOTE — Pre-Procedure Instructions (Signed)
Office notified and faxed positive staph results.

## 2015-06-15 ENCOUNTER — Inpatient Hospital Stay
Admission: RE | Admit: 2015-06-15 | Discharge: 2015-06-17 | DRG: 470 | Disposition: A | Discharge status: Home Health | Payer: Medicare Other | Source: Ambulatory Visit | Attending: Orthopedic Surgery | Admitting: Orthopedic Surgery

## 2015-06-15 ENCOUNTER — Encounter: Payer: Self-pay | Admitting: *Deleted

## 2015-06-15 ENCOUNTER — Inpatient Hospital Stay: Payer: Medicare Other | Admitting: Anesthesiology

## 2015-06-15 ENCOUNTER — Inpatient Hospital Stay: Payer: Medicare Other

## 2015-06-15 ENCOUNTER — Encounter
Admission: RE | Disposition: A | Discharge status: Home Health | Payer: Self-pay | Source: Ambulatory Visit | Attending: Orthopedic Surgery

## 2015-06-15 DIAGNOSIS — E785 Hyperlipidemia, unspecified: Secondary | ICD-10-CM | POA: Diagnosis present

## 2015-06-15 DIAGNOSIS — Z7982 Long term (current) use of aspirin: Secondary | ICD-10-CM | POA: Diagnosis not present

## 2015-06-15 DIAGNOSIS — Z96659 Presence of unspecified artificial knee joint: Secondary | ICD-10-CM

## 2015-06-15 DIAGNOSIS — Z85828 Personal history of other malignant neoplasm of skin: Secondary | ICD-10-CM

## 2015-06-15 DIAGNOSIS — K219 Gastro-esophageal reflux disease without esophagitis: Secondary | ICD-10-CM | POA: Diagnosis present

## 2015-06-15 DIAGNOSIS — F419 Anxiety disorder, unspecified: Secondary | ICD-10-CM | POA: Diagnosis present

## 2015-06-15 DIAGNOSIS — K589 Irritable bowel syndrome without diarrhea: Secondary | ICD-10-CM | POA: Diagnosis present

## 2015-06-15 DIAGNOSIS — Z79899 Other long term (current) drug therapy: Secondary | ICD-10-CM

## 2015-06-15 DIAGNOSIS — Z96651 Presence of right artificial knee joint: Secondary | ICD-10-CM | POA: Diagnosis present

## 2015-06-15 DIAGNOSIS — F329 Major depressive disorder, single episode, unspecified: Secondary | ICD-10-CM | POA: Diagnosis present

## 2015-06-15 DIAGNOSIS — R509 Fever, unspecified: Secondary | ICD-10-CM | POA: Diagnosis not present

## 2015-06-15 DIAGNOSIS — I10 Essential (primary) hypertension: Secondary | ICD-10-CM | POA: Diagnosis present

## 2015-06-15 DIAGNOSIS — M1712 Unilateral primary osteoarthritis, left knee: Secondary | ICD-10-CM | POA: Diagnosis present

## 2015-06-15 HISTORY — PX: KNEE ARTHROPLASTY: SHX992

## 2015-06-15 SURGERY — ARTHROPLASTY, KNEE, TOTAL, USING IMAGELESS COMPUTER-ASSISTED NAVIGATION
Anesthesia: Spinal | Laterality: Left | Wound class: Clean

## 2015-06-15 MED ORDER — ONDANSETRON HCL 4 MG/2ML IJ SOLN: 4.0000 mg | Freq: Once | INTRAMUSCULAR | Status: DC | PRN

## 2015-06-15 MED ORDER — TETRACAINE HCL 1 % IJ SOLN
INTRAMUSCULAR | Status: DC | PRN
  Administered 2015-06-15: 8 mg via INTRASPINAL

## 2015-06-15 MED ORDER — ONDANSETRON HCL 4 MG PO TABS: 4.0000 mg | ORAL_TABLET | Freq: Four times a day (QID) | ORAL | Status: DC | PRN

## 2015-06-15 MED ORDER — MAGNESIUM HYDROXIDE 400 MG/5ML PO SUSP
30.0000 mL | Freq: Every day | ORAL | Status: DC | PRN
  Filled 2015-06-15: qty 30

## 2015-06-15 MED ORDER — CEFAZOLIN SODIUM 1-5 GM-% IV SOLN
INTRAVENOUS | Status: AC
  Filled 2015-06-15: qty 50

## 2015-06-15 MED ORDER — BUPIVACAINE-EPINEPHRINE 0.25% -1:200000 IJ SOLN
INTRAMUSCULAR | Status: DC | PRN
  Administered 2015-06-15: 30 mL

## 2015-06-15 MED ORDER — NEOMYCIN-POLYMYXIN B GU 40-200000 IR SOLN
Status: DC | PRN
  Administered 2015-06-15: 14 mL

## 2015-06-15 MED ORDER — MENTHOL 3 MG MT LOZG: 1.0000 | LOZENGE | OROMUCOSAL | Status: DC | PRN

## 2015-06-15 MED ORDER — SODIUM CHLORIDE 0.9 % IJ SOLN
INTRAMUSCULAR | Status: AC
  Filled 2015-06-15: qty 50

## 2015-06-15 MED ORDER — PRAVASTATIN SODIUM 20 MG PO TABS
20.0000 mg | ORAL_TABLET | Freq: Every evening | ORAL | Status: DC
  Administered 2015-06-15 – 2015-06-16 (×2): 20 mg via ORAL
  Filled 2015-06-15 (×3): qty 1

## 2015-06-15 MED ORDER — MORPHINE SULFATE (PF) 2 MG/ML IV SOLN
2.0000 mg | INTRAVENOUS | Status: DC | PRN
  Administered 2015-06-15: 2 mg via INTRAVENOUS
  Filled 2015-06-15: qty 1

## 2015-06-15 MED ORDER — BISACODYL 10 MG RE SUPP
10.0000 mg | Freq: Every day | RECTAL | Status: DC | PRN
  Administered 2015-06-17: 10 mg via RECTAL

## 2015-06-15 MED ORDER — PROPOFOL 10 MG/ML IV BOLUS
INTRAVENOUS | Status: DC | PRN
  Administered 2015-06-15: 20 mg via INTRAVENOUS

## 2015-06-15 MED ORDER — FENTANYL CITRATE (PF) 100 MCG/2ML IJ SOLN: 25.0000 ug | INTRAMUSCULAR | Status: DC | PRN

## 2015-06-15 MED ORDER — TRANEXAMIC ACID 1000 MG/10ML IV SOLN
1000.0000 mg | Freq: Once | INTRAVENOUS | Status: AC
  Administered 2015-06-15: 1000 mg via INTRAVENOUS
  Filled 2015-06-15: qty 10

## 2015-06-15 MED ORDER — GLYCOPYRROLATE 0.2 MG/ML IJ SOLN
INTRAMUSCULAR | Status: DC | PRN
  Administered 2015-06-15: 0.2 mg via INTRAVENOUS

## 2015-06-15 MED ORDER — BUPIVACAINE HCL (PF) 0.75 % IJ SOLN
INTRAMUSCULAR | Status: DC | PRN
  Administered 2015-06-15: 2 mg via INTRATHECAL

## 2015-06-15 MED ORDER — BUPIVACAINE LIPOSOME 1.3 % IJ SUSP
INTRAMUSCULAR | Status: AC
  Filled 2015-06-15: qty 20

## 2015-06-15 MED ORDER — EPHEDRINE SULFATE 50 MG/ML IJ SOLN
INTRAMUSCULAR | Status: DC | PRN
  Administered 2015-06-15: 10 mg via INTRAVENOUS

## 2015-06-15 MED ORDER — CEFAZOLIN SODIUM-DEXTROSE 2-4 GM/100ML-% IV SOLN
2.0000 g | Freq: Four times a day (QID) | INTRAVENOUS | Status: AC
  Administered 2015-06-15 – 2015-06-16 (×3): 2 g via INTRAVENOUS
  Filled 2015-06-15 (×4): qty 100

## 2015-06-15 MED ORDER — FAMOTIDINE 20 MG PO TABS
20.0000 mg | ORAL_TABLET | Freq: Once | ORAL | Status: AC
  Administered 2015-06-15: 20 mg via ORAL

## 2015-06-15 MED ORDER — COLESEVELAM HCL 625 MG PO TABS
625.0000 mg | ORAL_TABLET | Freq: Every day | ORAL | Status: DC
  Administered 2015-06-15 – 2015-06-17 (×3): 625 mg via ORAL
  Filled 2015-06-15 (×3): qty 1

## 2015-06-15 MED ORDER — PANTOPRAZOLE SODIUM 40 MG PO TBEC
40.0000 mg | DELAYED_RELEASE_TABLET | Freq: Two times a day (BID) | ORAL | Status: DC
  Administered 2015-06-15 – 2015-06-17 (×5): 40 mg via ORAL
  Filled 2015-06-15 (×5): qty 1

## 2015-06-15 MED ORDER — ONDANSETRON HCL 4 MG/2ML IJ SOLN
INTRAMUSCULAR | Status: DC | PRN
  Administered 2015-06-15: 4 mg via INTRAVENOUS

## 2015-06-15 MED ORDER — BUPIVACAINE LIPOSOME 1.3 % IJ SUSP
INTRAMUSCULAR | Status: DC | PRN
  Administered 2015-06-15: 60 mL

## 2015-06-15 MED ORDER — CEFAZOLIN SODIUM 1-5 GM-% IV SOLN: 1.0000 g | Freq: Once | INTRAVENOUS | Status: DC

## 2015-06-15 MED ORDER — DIPHENHYDRAMINE HCL 12.5 MG/5ML PO ELIX: 12.5000 mg | ORAL_SOLUTION | ORAL | Status: DC | PRN

## 2015-06-15 MED ORDER — ONDANSETRON HCL 4 MG/2ML IJ SOLN: 4.0000 mg | Freq: Four times a day (QID) | INTRAMUSCULAR | Status: DC | PRN

## 2015-06-15 MED ORDER — ACETAMINOPHEN 650 MG RE SUPP: 650.0000 mg | Freq: Four times a day (QID) | RECTAL | Status: DC | PRN

## 2015-06-15 MED ORDER — PHENOL 1.4 % MT LIQD: 1.0000 | OROMUCOSAL | Status: DC | PRN

## 2015-06-15 MED ORDER — OXYCODONE HCL 5 MG PO TABS
5.0000 mg | ORAL_TABLET | ORAL | Status: DC | PRN
  Administered 2015-06-15: 10 mg via ORAL
  Administered 2015-06-15 – 2015-06-16 (×3): 5 mg via ORAL
  Administered 2015-06-16: 10 mg via ORAL
  Administered 2015-06-16: 5 mg via ORAL
  Filled 2015-06-15 (×3): qty 1
  Filled 2015-06-15: qty 2
  Filled 2015-06-15: qty 1
  Filled 2015-06-15: qty 2

## 2015-06-15 MED ORDER — CELECOXIB 200 MG PO CAPS
200.0000 mg | ORAL_CAPSULE | Freq: Two times a day (BID) | ORAL | Status: DC
  Administered 2015-06-15 – 2015-06-17 (×5): 200 mg via ORAL
  Filled 2015-06-15 (×5): qty 1

## 2015-06-15 MED ORDER — TRANEXAMIC ACID 1000 MG/10ML IV SOLN
1500.0000 mg | INTRAVENOUS | Status: AC
  Filled 2015-06-15: qty 15

## 2015-06-15 MED ORDER — LACTATED RINGERS IV SOLN
INTRAVENOUS | Status: DC | PRN
  Administered 2015-06-15 (×2): via INTRAVENOUS

## 2015-06-15 MED ORDER — NEOMYCIN-POLYMYXIN B GU 40-200000 IR SOLN
Status: AC
  Filled 2015-06-15: qty 20

## 2015-06-15 MED ORDER — FLEET ENEMA 7-19 GM/118ML RE ENEM: 1.0000 | ENEMA | Freq: Once | RECTAL | Status: DC | PRN

## 2015-06-15 MED ORDER — LACTATED RINGERS IV SOLN
INTRAVENOUS | Status: DC
  Administered 2015-06-15: 75 mL/h via INTRAVENOUS

## 2015-06-15 MED ORDER — ACETAMINOPHEN 10 MG/ML IV SOLN
INTRAVENOUS | Status: DC | PRN
  Administered 2015-06-15: 1000 mg via INTRAVENOUS

## 2015-06-15 MED ORDER — ALUM & MAG HYDROXIDE-SIMETH 200-200-20 MG/5ML PO SUSP: 30.0000 mL | ORAL | Status: DC | PRN

## 2015-06-15 MED ORDER — CEFAZOLIN SODIUM-DEXTROSE 2-3 GM-% IV SOLR
INTRAVENOUS | Status: DC | PRN
  Administered 2015-06-15: 2 g via INTRAVENOUS

## 2015-06-15 MED ORDER — BUPIVACAINE-EPINEPHRINE (PF) 0.25% -1:200000 IJ SOLN
INTRAMUSCULAR | Status: AC
  Filled 2015-06-15: qty 30

## 2015-06-15 MED ORDER — MIDAZOLAM HCL 2 MG/2ML IJ SOLN
INTRAMUSCULAR | Status: DC | PRN
  Administered 2015-06-15: 2 mg via INTRAVENOUS

## 2015-06-15 MED ORDER — ACETAMINOPHEN 10 MG/ML IV SOLN
1000.0000 mg | Freq: Four times a day (QID) | INTRAVENOUS | Status: AC
Start: 2015-06-15 — End: 2015-06-16
  Administered 2015-06-15 – 2015-06-16 (×3): 1000 mg via INTRAVENOUS
  Filled 2015-06-15 (×4): qty 100

## 2015-06-15 MED ORDER — FAMOTIDINE 20 MG PO TABS
ORAL_TABLET | ORAL | Status: AC
Start: 2015-06-15 — End: 2015-06-15
  Administered 2015-06-15: 20 mg via ORAL
  Filled 2015-06-15: qty 1

## 2015-06-15 MED ORDER — FENTANYL CITRATE (PF) 100 MCG/2ML IJ SOLN
INTRAMUSCULAR | Status: DC | PRN
Start: 2015-06-15 — End: 2015-06-15
  Administered 2015-06-15: 50 ug via INTRAVENOUS

## 2015-06-15 MED ORDER — ACETAMINOPHEN 325 MG PO TABS
650.0000 mg | ORAL_TABLET | Freq: Four times a day (QID) | ORAL | Status: DC | PRN
  Administered 2015-06-16: 650 mg via ORAL
  Filled 2015-06-15: qty 2

## 2015-06-15 MED ORDER — SODIUM CHLORIDE 0.9 % IV SOLN
10000.0000 ug | INTRAVENOUS | Status: DC | PRN
  Administered 2015-06-15 (×2): 50 ug via INTRAVENOUS

## 2015-06-15 MED ORDER — SERTRALINE HCL 100 MG PO TABS
100.0000 mg | ORAL_TABLET | Freq: Every evening | ORAL | Status: DC
  Administered 2015-06-15 – 2015-06-16 (×2): 100 mg via ORAL
  Filled 2015-06-15 (×2): qty 1

## 2015-06-15 MED ORDER — ACETAMINOPHEN 10 MG/ML IV SOLN
INTRAVENOUS | Status: AC
  Filled 2015-06-15: qty 100

## 2015-06-15 MED ORDER — SENNOSIDES-DOCUSATE SODIUM 8.6-50 MG PO TABS
1.0000 | ORAL_TABLET | Freq: Two times a day (BID) | ORAL | Status: DC
  Administered 2015-06-15 – 2015-06-17 (×5): 1 via ORAL
  Filled 2015-06-15 (×5): qty 1

## 2015-06-15 MED ORDER — TRAMADOL HCL 50 MG PO TABS
50.0000 mg | ORAL_TABLET | ORAL | Status: DC | PRN
  Administered 2015-06-16: 100 mg via ORAL
  Filled 2015-06-15: qty 2

## 2015-06-15 MED ORDER — ENOXAPARIN SODIUM 30 MG/0.3ML ~~LOC~~ SOLN
30.0000 mg | Freq: Two times a day (BID) | SUBCUTANEOUS | Status: DC
  Administered 2015-06-16 – 2015-06-17 (×4): 30 mg via SUBCUTANEOUS
  Filled 2015-06-15 (×4): qty 0.3

## 2015-06-15 MED ORDER — SODIUM CHLORIDE 0.9 % IV SOLN
INTRAVENOUS | Status: DC
  Administered 2015-06-15 – 2015-06-16 (×2): via INTRAVENOUS

## 2015-06-15 MED ORDER — METOCLOPRAMIDE HCL 10 MG PO TABS
10.0000 mg | ORAL_TABLET | Freq: Three times a day (TID) | ORAL | Status: AC
  Administered 2015-06-15 – 2015-06-17 (×8): 10 mg via ORAL
  Filled 2015-06-15 (×8): qty 1

## 2015-06-15 MED ORDER — PROPOFOL 500 MG/50ML IV EMUL
INTRAVENOUS | Status: DC | PRN
Start: 2015-06-15 — End: 2015-06-15
  Administered 2015-06-15: 75 ug/kg/min via INTRAVENOUS

## 2015-06-15 MED ORDER — FERROUS SULFATE 325 (65 FE) MG PO TABS
325.0000 mg | ORAL_TABLET | Freq: Two times a day (BID) | ORAL | Status: DC
  Administered 2015-06-15 – 2015-06-17 (×4): 325 mg via ORAL
  Filled 2015-06-15 (×4): qty 1

## 2015-06-15 SURGICAL SUPPLY — 57 items
AUTOTRANSFUS HAS 1/8 (MISCELLANEOUS) ×2
BATTERY INSTRU NAVIGATION (MISCELLANEOUS) ×8 IMPLANT
BLADE SAW 1 (BLADE) ×2 IMPLANT
BLADE SAW 1/2 (BLADE) ×2 IMPLANT
CANISTER SUCT 1200ML W/VALVE (MISCELLANEOUS) ×2 IMPLANT
CANISTER SUCT 3000ML (MISCELLANEOUS) ×4 IMPLANT
CAP KNEE TOTAL 3 SIGMA ×2 IMPLANT
CATH TRAY METER 16FR LF (MISCELLANEOUS) ×2 IMPLANT
CEMENT HV SMART SET (Cement) ×4 IMPLANT
COOLER POLAR GLACIER W/PUMP (MISCELLANEOUS) ×2 IMPLANT
DRAPE SHEET LG 3/4 BI-LAMINATE (DRAPES) ×2 IMPLANT
DRSG DERMACEA 8X12 NADH (GAUZE/BANDAGES/DRESSINGS) ×2 IMPLANT
DRSG OPSITE POSTOP 4X14 (GAUZE/BANDAGES/DRESSINGS) ×2 IMPLANT
DRSG TEGADERM 4X4.75 (GAUZE/BANDAGES/DRESSINGS) ×2 IMPLANT
DURAPREP 26ML APPLICATOR (WOUND CARE) ×4 IMPLANT
ELECT CAUTERY BLADE 6.4 (BLADE) ×4 IMPLANT
ELECT REM PT RETURN 9FT ADLT (ELECTROSURGICAL) ×2
ELECTRODE REM PT RTRN 9FT ADLT (ELECTROSURGICAL) ×1 IMPLANT
EX-PIN ORTHOLOCK NAV 4X150 (PIN) ×4 IMPLANT
GLOVE BIOGEL M STRL SZ7.5 (GLOVE) ×4 IMPLANT
GLOVE INDICATOR 8.0 STRL GRN (GLOVE) ×2 IMPLANT
GLOVE SURG 9.0 ORTHO LTXF (GLOVE) ×2 IMPLANT
GLOVE SURG ORTHO 9.0 STRL STRW (GLOVE) ×2 IMPLANT
GOWN STRL REUS W/ TWL LRG LVL3 (GOWN DISPOSABLE) ×2 IMPLANT
GOWN STRL REUS W/TWL 2XL LVL3 (GOWN DISPOSABLE) ×2 IMPLANT
GOWN STRL REUS W/TWL LRG LVL3 (GOWN DISPOSABLE) ×2
HANDPIECE SUCTION TUBG SURGILV (MISCELLANEOUS) ×2 IMPLANT
HOLDER FOLEY CATH W/STRAP (MISCELLANEOUS) ×2 IMPLANT
HOOD PEEL AWAY FLYTE STAYCOOL (MISCELLANEOUS) ×4 IMPLANT
KIT RM TURNOVER STRD PROC AR (KITS) ×2 IMPLANT
KNIFE SCULPS 14X20 (INSTRUMENTS) ×2 IMPLANT
NDL SAFETY 18GX1.5 (NEEDLE) ×2 IMPLANT
NEEDLE SPNL 20GX3.5 QUINCKE YW (NEEDLE) ×2 IMPLANT
NS IRRIG 500ML POUR BTL (IV SOLUTION) ×2 IMPLANT
PACK TOTAL KNEE (MISCELLANEOUS) ×2 IMPLANT
PAD WRAPON POLAR KNEE (MISCELLANEOUS) ×1 IMPLANT
PENCIL ELECTRO HAND CTR (MISCELLANEOUS) ×2 IMPLANT
PIN DRILL QUICK PACK ×2 IMPLANT
PIN FIXATION 1/8DIA X 3INL (PIN) ×2 IMPLANT
SOL .9 NS 3000ML IRR  AL (IV SOLUTION) ×1
SOL .9 NS 3000ML IRR UROMATIC (IV SOLUTION) ×1 IMPLANT
SOL PREP PVP 2OZ (MISCELLANEOUS) ×2
SOLUTION PREP PVP 2OZ (MISCELLANEOUS) ×1 IMPLANT
SPONGE DRAIN TRACH 4X4 STRL 2S (GAUZE/BANDAGES/DRESSINGS) ×2 IMPLANT
STAPLER SKIN PROX 35W (STAPLE) ×2 IMPLANT
SUCTION FRAZIER HANDLE 10FR (MISCELLANEOUS) ×1
SUCTION TUBE FRAZIER 10FR DISP (MISCELLANEOUS) ×1 IMPLANT
SUT VIC AB 0 CT1 36 (SUTURE) ×2 IMPLANT
SUT VIC AB 1 CT1 36 (SUTURE) ×4 IMPLANT
SUT VIC AB 2-0 CT2 27 (SUTURE) ×2 IMPLANT
SYR 20CC LL (SYRINGE) ×2 IMPLANT
SYR 30ML LL (SYRINGE) ×2 IMPLANT
SYR 50ML LL SCALE MARK (SYRINGE) ×2 IMPLANT
SYSTEM AUTOTRANSFUS DUAL TROCR (MISCELLANEOUS) ×1 IMPLANT
TOWEL OR 17X26 4PK STRL BLUE (TOWEL DISPOSABLE) ×2 IMPLANT
TOWER CARTRIDGE SMART MIX (DISPOSABLE) ×2 IMPLANT
WRAPON POLAR PAD KNEE (MISCELLANEOUS) ×2

## 2015-06-15 NOTE — NC FL2 (Signed)
[place] LEVEL OF CARE SCREENING TOOL     IDENTIFICATION  Patient Name: Travis Mills. Birthdate: 1945/05/19 Sex: male Admission Date (Current Location): 06/15/2015  [place] and [place] Number:  Engineering geologist and Address:  [facility]: [address], [address], [place], [place] [address]      Provider Number: [id number]  Attending Physician Name and Address:  Dereck Leep, MD  Relative Name and Phone Number:       Current Level of Care: Hospital Recommended Level of Care: [place] Prior Approval Number:    Date Approved/Denied:   PASRR Number:  ([id number])  Discharge Plan: SNF    Current Diagnoses: Patient Active Problem List   Diagnosis Date Noted  . 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9 cm)  BEHAVIORAL SYMPTOMS/MOOD NEUROLOGICAL BOWEL NUTRITION STATUS   (none )  (none ) Continent Diet (Regular Diet )  AMBULATORY STATUS COMMUNICATION OF NEEDS Skin   Extensive Assist Verbally Surgical wounds (Incision: Left Knee )                       Personal Care Assistance Level of Assistance  Bathing, Feeding, Dressing Bathing Assistance: Limited assistance Feeding assistance: Independent Dressing Assistance: Limited assistance     Functional Limitations Info  Hearing, Sight, Speech Sight  Info: Adequate Hearing Info: Adequate Speech Info: Adequate    SPECIAL CARE FACTORS FREQUENCY  PT (By licensed PT), OT (By licensed OT)     PT Frequency:  (5) OT Frequency:  (5)            Contractures      Additional Factors Info  Code Status, Allergies Code Status Info:  (Not on File ) Allergies Info:  (No Known Allergies. )           Current Medications (06/15/2015):  This is the current hospital active medication list Current Facility-Administered Medications  Medication Dose Route Frequency Provider Last Rate Last Dose  . 0.9 %  sodium chloride infusion   Intravenous Continuous Dereck Leep, MD 100 mL/hr at 06/15/15 1247    . acetaminophen (OFIRMEV) IV 1,000 mg  1,000 mg Intravenous Q6H Dereck Leep, MD   1,000 mg at 06/15/15 1247  . acetaminophen (TYLENOL) tablet 650 mg  650 mg Oral Q6H PRN Dereck Leep, MD       Or  . acetaminophen (TYLENOL) suppository 650 mg  650 mg Rectal Q6H PRN Dereck Leep, MD      . alum & mag hydroxide-simeth (MAALOX/MYLANTA) 200-200-20 MG/5ML suspension 30 mL  30 mL Oral Q4H PRN Dereck Leep, MD      . bisacodyl (DULCOLAX) suppository 10 mg  10 mg Rectal Daily PRN Dereck Leep, MD      . ceFAZolin (ANCEF) 1-5 GM-%  IVPB           . ceFAZolin (ANCEF) IVPB 1 g/50 mL premix  1 g Intravenous Once Dereck Leep, MD      . ceFAZolin (ANCEF) IVPB 2g/100 mL premix  2 g Intravenous Q6H Dereck Leep, MD   2 g at 06/15/15 1310  . celecoxib (CELEBREX) capsule 200 mg  200 mg Oral Q12H Dereck Leep, MD   200 mg at 06/15/15 1259  . colesevelam [facility]) tablet 625 mg  625 mg Oral Daily Dereck Leep, MD   625 mg at 06/15/15 1259  . diphenhydrAMINE (BENADRYL) 12.5 MG/5ML elixir 12.5-25 mg  12.5-25 mg Oral Q4H PRN Dereck Leep, MD      . enoxaparin (LOVENOX) injection 30 mg  30 mg Subcutaneous Q12H Dereck Leep, MD   30 mg at 06/15/15 1215  . ferrous sulfate tablet 325 mg  325 mg Oral BID WC Dereck Leep, MD      . lactated ringers  infusion   Intravenous Continuous Gunnar Bulla, MD 75 mL/hr at 06/15/15 0650 75 mL/hr at 06/15/15 0650  . magnesium hydroxide (MILK OF MAGNESIA) suspension 30 mL  30 mL Oral Daily PRN Dereck Leep, MD      . menthol-cetylpyridinium (CEPACOL) lozenge 3 mg  1 lozenge Oral PRN Dereck Leep, MD       Or  . phenol (CHLORASEPTIC) mouth spray 1 spray  1 spray Mouth/Throat PRN Dereck Leep, MD      . metoCLOPramide (REGLAN) tablet 10 mg  10 mg Oral TID AC & HS Dereck Leep, MD      . morphine 2 MG/ML injection 2 mg  2 mg Intravenous Q2H PRN Dereck Leep, MD   2 mg at 06/15/15 1358  . ondansetron (ZOFRAN) tablet 4 mg  4 mg Oral Q6H PRN Dereck Leep, MD       Or  . ondansetron (ZOFRAN) injection 4 mg  4 mg Intravenous Q6H PRN Dereck Leep, MD      . oxyCODONE (Oxy IR/ROXICODONE) immediate release tablet 5-10 mg  5-10 mg Oral Q4H PRN Dereck Leep, MD   5 mg at 06/15/15 1252  . pantoprazole (PROTONIX) EC tablet 40 mg  40 mg Oral BID Dereck Leep, MD   40 mg at 06/15/15 1259  . pravastatin (PRAVACHOL) tablet 20 mg  20 mg Oral QPM Dereck Leep, MD      . senna-docusate (Senokot-S) tablet 1 tablet  1 tablet Oral BID Dereck Leep, MD   1 tablet at 06/15/15 1259  . sertraline (ZOLOFT) tablet 100 mg  100 mg Oral QPM Dereck Leep, MD      . sodium phosphate (FLEET) 7-19 GM/118ML enema 1 enema  1 enema Rectal Once PRN Dereck Leep, MD      . traMADol Veatrice Bourbon) tablet 50-100 mg  50-100 mg Oral Q4H PRN Dereck Leep, MD      . tranexamic acid (CYKLOKAPRON) 1,500 mg in sodium chloride 0.9 % 100 mL IVPB  1,500 mg Intravenous To OR Dereck Leep, MD         Discharge Medications: Please see discharge summary for a list of discharge medications.  Relevant Imaging Results:  Relevant Lab Results:   Additional Information  (SSN: [id number])  Loralyn Freshwater, LCSW

## 2015-06-15 NOTE — Brief Op Note (Signed)
06/15/2015  11:05 AM  PATIENT:  Travis Mills.  70 y.o. male  PRE-OPERATIVE DIAGNOSIS:  PRIMARY OSTEOARTHRITIS of the left knee  POST-OPERATIVE DIAGNOSIS:  Same  PROCEDURE:  Procedure(s): COMPUTER ASSISTED TOTAL KNEE ARTHROPLASTY (Left)  SURGEON:  Surgeon(s) and Role:    * Dereck Leep, MD - Primary  ASSISTANTS: Vance Peper, PA   ANESTHESIA:   spinal  EBL:  Total I/O In: 1400 [I.V.:1400] Out: 950 [Urine:900; Blood:50]  BLOOD ADMINISTERED:none  DRAINS: 2 medium drains to a reinfusion system   LOCAL MEDICATIONS USED:  MARCAINE    and OTHER Exparel  SPECIMEN:  No Specimen  DISPOSITION OF SPECIMEN:  N/A  COUNTS:  YES  TOURNIQUET:   91 minutes  DICTATION: .Dragon Dictation  PLAN OF CARE: Admit to inpatient   PATIENT DISPOSITION:  PACU - hemodynamically stable.   Delay start of Pharmacological VTE agent (>24hrs) due to surgical blood loss or risk of bleeding: yes

## 2015-06-15 NOTE — Anesthesia Preprocedure Evaluation (Signed)
Anesthesia Evaluation  Patient identified by MRN, date of birth, ID band Patient awake    Reviewed: Allergy & Precautions  History of Anesthesia Complications (+) PONV  Airway Mallampati: II       Dental  (+) Teeth Intact   Pulmonary neg pulmonary ROS,    breath sounds clear to auscultation       Cardiovascular Exercise Tolerance: Good + dysrhythmias  Rhythm:Regular Rate:Bradycardia  Complete heart block while under anesthesia: elevated lithium levels?   Neuro/Psych negative neurological ROS     GI/Hepatic Neg liver ROS, GERD  ,  Endo/Other  negative endocrine ROS  Renal/GU negative Renal ROS     Musculoskeletal   Abdominal   Peds  Hematology negative hematology ROS (+)   Anesthesia Other Findings   Reproductive/Obstetrics                             Anesthesia Physical Anesthesia Plan  ASA: III  Anesthesia Plan: Spinal   Post-op Pain Management:    Induction: Intravenous  Airway Management Planned: Natural Airway and Nasal Cannula  Additional Equipment:   Intra-op Plan:   Post-operative Plan:   Informed Consent: I have reviewed the patients History and Physical, chart, labs and discussed the procedure including the risks, benefits and alternatives for the proposed anesthesia with the patient or authorized representative who has indicated his/her understanding and acceptance.     Plan Discussed with: CRNA  Anesthesia Plan Comments:         Anesthesia Quick Evaluation

## 2015-06-15 NOTE — Op Note (Signed)
OPERATIVE NOTE  DATE OF SURGERY:  06/15/2015  PATIENT NAME:  Travis Mills.   DOB: [birth year]  MRN: [id number]  PRE-OPERATIVE DIAGNOSIS: Degenerative arthrosis of the left knee, primary  POST-OPERATIVE DIAGNOSIS:  Same  PROCEDURE:  Left total knee arthroplasty using computer-assisted navigation  SURGEON:  Marciano Sequin. M.D.  ASSISTANT:  Vance Peper, PA (present and scrubbed throughout the case, critical for assistance with exposure, retraction, instrumentation, and closure)  ANESTHESIA: spinal  ESTIMATED BLOOD LOSS: 50 mL  FLUIDS REPLACED: 1400 mL of crystalloid  TOURNIQUET TIME: 91 minutes  DRAINS: 2 medium drains to a reinfusion system  SOFT TISSUE RELEASES: Anterior cruciate ligament, posterior cruciate ligament, deep medial collateral ligament, patellofemoral ligament   IMPLANTS UTILIZED: DePuy PFC Sigma size 4 posterior stabilized femoral component (cemented), size 5 MBT tibial component (cemented), 38 mm 3 peg oval dome patella (cemented), and a 10 mm stabilized rotating platform polyethylene insert.  INDICATIONS FOR SURGERY: Travis Mills. is a 70 y.o. year old male with a long history of progressive knee pain. X-rays demonstrated severe degenerative changes in tricompartmental fashion. The patient had not seen any significant improvement despite conservative nonsurgical intervention. After discussion of the risks and benefits of surgical intervention, the patient expressed understanding of the risks benefits and agree with plans for total knee arthroplasty.   The risks, benefits, and alternatives were discussed at length including but not limited to the risks of infection, bleeding, nerve injury, stiffness, blood clots, the need for revision surgery, cardiopulmonary complications, among others, and they were willing to proceed.  PROCEDURE IN DETAIL: The patient was brought into the operating room and, after adequate spinal anesthesia was achieved, a tourniquet  was placed on the patient's upper thigh. The patient's knee and leg were cleaned and prepped with alcohol and DuraPrep and draped in the usual sterile fashion. A "timeout" was performed as per usual protocol. The lower extremity was exsanguinated using an Esmarch, and the tourniquet was inflated to 300 mmHg. An anterior longitudinal incision was made followed by a standard mid vastus approach. The deep fibers of the medial collateral ligament were elevated in a subperiosteal fashion off of the medial flare of the tibia so as to maintain a continuous soft tissue sleeve. The patella was subluxed laterally and the patellofemoral ligament was incised. Inspection of the knee demonstrated severe degenerative changes with full-thickness loss of articular cartilage. Osteophytes were debrided using a rongeur. Anterior and posterior cruciate ligaments were excised. Two 4.0 mm Schanz pins were inserted in the femur and into the tibia for attachment of the array of trackers used for computer-assisted navigation. Hip center was identified using a circumduction technique. Distal landmarks were mapped using the computer. The distal femur and proximal tibia were mapped using the computer. The distal femoral cutting guide was positioned using computer-assisted navigation so as to achieve a 5 distal valgus cut. The femur was sized and it was felt that a size 4 femoral component was appropriate. A size 4 femoral cutting guide was positioned and the anterior cut was performed and verified using the computer. This was followed by completion of the posterior and chamfer cuts. Femoral cutting guide for the central box was then positioned in the center box cut was performed.  Attention was then directed to the proximal tibia. Medial and lateral menisci were excised. The extramedullary tibial cutting guide was positioned using computer-assisted navigation so as to achieve a 0 varus-valgus alignment and 0 posterior slope. The cut was  performed  and verified using the computer. The proximal tibia was sized and it was felt that a size 5 tibial tray was appropriate. Tibial and femoral trials were inserted followed by insertion of a 10 mm polyethylene insert. This allowed for excellent mediolateral soft tissue balancing both in flexion and in full extension. Finally, the patella was cut and prepared so as to accommodate a 38 mm 3 peg oval dome patella. A patella trial was placed and the knee was placed through a range of motion with excellent patellar tracking appreciated. The femoral trial was removed after debridement of posterior osteophytes. The central post-hole for the tibial component was reamed followed by insertion of a keel punch. Tibial trials were then removed. Cut surfaces of bone were irrigated with copious amounts of normal saline with antibiotic solution using pulsatile lavage and then suctioned dry. Polymethylmethacrylate cement was prepared in the usual fashion using a vacuum mixer. Cement was applied to the cut surface of the proximal tibia as well as along the undersurface of a size 5 MBT tibial component. Tibial component was positioned and impacted into place. Excess cement was removed using [employer] fast streamer. Cement was then applied to the cut surfaces of the femur as well as along the posterior flanges of the size 4 femoral component. The femoral component was positioned and impacted into place. Excess cement was removed using [employer] fast streamer. A 10 mm polyethylene trial was inserted and the knee was brought into full extension with steady axial compression applied. Finally, cement was applied to the backside of a 38 mm 3 peg oval dome patella and the patellar component was positioned and patellar clamp applied. Excess cement was removed using [employer] fast streamer. After adequate curing of the cement, the tourniquet was deflated after a total tourniquet time of 91 minutes. Hemostasis was achieved using electrocautery. The knee was  irrigated with copious amounts of normal saline with antibiotic solution using pulsatile lavage and then suctioned dry. 20 mL of 1.3% Exparel in 40 mL of normal saline was injected along the posterior capsule, medial and lateral gutters, and along the arthrotomy site. A 10 mm stabilized rotating platform polyethylene insert was inserted and the knee was placed through a range of motion with excellent mediolateral soft tissue balancing appreciated and excellent patellar tracking noted. 2 medium drains were placed in the wound bed and brought out through separate stab incisions to be attached to a reinfusion system. The medial parapatellar portion of the incision was reapproximated using interrupted sutures of #1 Vicryl. Subcutaneous tissue was then injected with a total of 30 cc of 0.25% Marcaine with epinephrine. Subcutaneous tissue was approximated in layers using first #0 Vicryl followed #2-0 Vicryl. The skin was approximated with skin staples. A sterile dressing was applied.  The patient tolerated the procedure well and was transported to the recovery room in stable condition.    Travis Mills., M.D.

## 2015-06-15 NOTE — Progress Notes (Signed)
PT Cancellation Note  Patient Details Name: Travis Mills. MRN: [id number] DOB: 22-Oct-1945   Cancelled Treatment:    Reason Eval/Treat Not Completed: Patient not medically ready. Patient reports he is unable to feel his feet still, minimal motor/sensory in calves and distal thighs. He is just now having pain in upper thigh. PT will defer mobility evaluation at this time until he is more medically appropriate.   Kerman Passey, PT, DPT    06/15/2015, 4:08 PM

## 2015-06-15 NOTE — H&P (Signed)
The patient has been re-examined, and the chart reviewed, and there have been no interval changes to the documented history and physical.    The risks, benefits, and alternatives have been discussed at length. The patient expressed understanding of the risks benefits and agreed with plans for surgical intervention.  Deondre P. Hooten, Jr. M.D.    

## 2015-06-15 NOTE — Anesthesia Procedure Notes (Addendum)
Date/Time: 06/15/2015 7:30 AM Performed by: [id number], DAVID Oxygen Delivery Method: Simple face mask   Spinal  Start time: 06/15/2015 7:29 AM End time: 06/15/2015 7:31 AM Reason for block: at surgeon's request Staffing Anesthesiologist: Marline Backbone F Performed by: anesthesiologist  Preanesthetic Checklist Completed: patient identified, site marked, pre-op evaluation, timeout performed, IV checked, risks and benefits discussed, monitors and equipment checked and at surgeon's request Spinal Block Patient position: sitting Prep: Betadine Patient monitoring: heart rate and blood pressure Approach: midline Location: L3-4 Injection technique: single-shot Needle Needle type: Tuohy  Needle gauge: 22 G Needle length: 9 cm Needle insertion depth: 6 cm Assessment Sensory level: T8

## 2015-06-15 NOTE — Transfer of Care (Signed)
Immediate Anesthesia Transfer of Care Note  Patient: Travis Mills.  Procedure(s) Performed: Procedure(s): COMPUTER ASSISTED TOTAL KNEE ARTHROPLASTY (Left)  Patient Location: PACU  Anesthesia Type:Spinal  Level of Consciousness: awake, alert  and oriented  Airway & Oxygen Therapy: Patient connected to face mask oxygen  Post-op Assessment: Report given to RN  Post vital signs: Reviewed and stable  Last Vitals:  Filed Vitals:   06/15/15 0622 06/15/15 1052  BP: 131/81 102/70  Pulse: 9 75  Temp: 36.7 C 36.8 C  Resp: 16 18    Last Pain:  Filed Vitals:   06/15/15 1052  PainSc: 4       Patients Stated Pain Goal: 1 ([id number])  Complications: No apparent anesthesia complications

## 2015-06-15 NOTE — Care Management Note (Addendum)
Case Management Note  Patient Details  Name: Travis Mills. MRN: [id number] Date of Birth: [birth year]  Subjective/Objective:                  Met with patient and his wife to discuss discharge planning. He has had orthopedic surgery in the past and plans to return home at discharge followed by [facility] like before. PT is pending however he declines SNF. He still has his DME from last visit including front-wheeled walker. He uses [facility]  [phone] for Rx. Patient states his wife will administer Lovenox like before. He states it cost over $400 last visit.   Action/Plan: List of home health care agencies left with patient. Referral made to [facility]. Lovenox 34m #14 called in to [facility] for price.   Expected Discharge Date:                  Expected Discharge Plan:     In-House Referral:     Discharge planning Services  CM Consult  Post Acute Care Choice:  Home Health Choice offered to:  Patient  DME Arranged:    DME Agency:     HH Arranged:  PT HH Agency:  [facility] Status of Service:  In process, will continue to follow  Medicare Important Message Given:    Date Medicare IM Given:    Medicare IM give by:    Date Additional Medicare IM Given:    Additional Medicare Important Message give by:     If discussed at [place] Stay Meetings, dates discussed:    Additional Comments: Lovenox $93.58. Patient informed. Patient agrees.  AMarshell Garfinkel RN 06/15/2015, 2:36 PM

## 2015-06-16 LAB — CBC
HEMATOCRIT: 35 % — AB (ref 40.0–52.0)
Hemoglobin: 12.4 g/dL — ABNORMAL LOW (ref 13.0–18.0)
MCH: 33.6 pg (ref 26.0–34.0)
MCHC: 35.4 g/dL (ref 32.0–36.0)
MCV: 95 fL (ref 80.0–100.0)
PLATELETS: 198 10*3/uL (ref 150–440)
RBC: 3.68 MIL/uL — AB (ref 4.40–5.90)
RDW: 13.7 % (ref 11.5–14.5)
WBC: 7.7 10*3/uL (ref 3.8–10.6)

## 2015-06-16 LAB — BASIC METABOLIC PANEL
ANION GAP: 8 (ref 5–15)
BUN: 14 mg/dL (ref 6–20)
CALCIUM: 7.9 mg/dL — AB (ref 8.9–10.3)
CO2: 22 mmol/L (ref 22–32)
Chloride: 105 mmol/L (ref 101–111)
Creatinine, Ser: 0.81 mg/dL (ref 0.61–1.24)
GLUCOSE: 103 mg/dL — AB (ref 65–99)
POTASSIUM: 3.6 mmol/L (ref 3.5–5.1)
Sodium: 135 mmol/L (ref 135–145)

## 2015-06-16 MED ORDER — TRAMADOL HCL 50 MG PO TABS: 50.0000 mg | ORAL_TABLET | ORAL | Status: DC | PRN

## 2015-06-16 MED ORDER — OXYCODONE HCL 5 MG PO TABS: 5.0000 mg | ORAL_TABLET | ORAL | Status: DC | PRN

## 2015-06-16 MED ORDER — ENOXAPARIN SODIUM 40 MG/0.4ML ~~LOC~~ SOLN: 40.0000 mg | SUBCUTANEOUS | Status: DC

## 2015-06-16 NOTE — Progress Notes (Signed)
Clinical Social Worker (CSW) received SNF consult. PT is recommending home health. RN Case Manager is aware of above. Please reconsult if future social work needs arise. CSW signing off.   Siarra Gilkerson Morgan, LCSW [phone] 

## 2015-06-16 NOTE — Progress Notes (Signed)
   Subjective: 1 Day Post-Op Procedure(s) (LRB): COMPUTER ASSISTED TOTAL KNEE ARTHROPLASTY (Left) Patient reports pain as 3 on 0-10 scale.   Patient is well, and has had no acute complaints or problems We will start therapy today.  Plan is to go Home after hospital stay. no nausea and no vomiting Patient denies any chest pains or shortness of breath. Objective: Vital signs in last 24 hours: Temp:  [97.3 F (36.3 C)-98.4 F (36.9 C)] 98.2 F (36.8 C) (05/04 0345) Pulse Rate:  [51-77] 77 (05/04 0345) Resp:  [13-18] 18 (05/04 0345) BP: (102-126)/(60-80) 103/66 mmHg (05/04 0345) SpO2:  [94 %-99 %] 95 % (05/04 0345) Heels are non tender and elevated off the bed using rolled towels Intake/Output from previous day: 05/03 0701 - 05/04 0700 In: 1961.7 [P.O.:120; I.V.:1641.7; IV Piggyback:200] Out: 2705 [Urine:2425; Drains:230; Blood:50] Intake/Output this shift: Total I/O In: -  Out: 730 [Urine:600; Drains:130]   Recent Labs  06/16/15 0352  HGB 12.4*    Recent Labs  06/16/15 0352  WBC 7.7  RBC 3.68*  HCT 35.0*  PLT 198    Recent Labs  06/16/15 0352  NA 135  K 3.6  CL 105  CO2 22  BUN 14  CREATININE 0.81  GLUCOSE 103*  CALCIUM 7.9*   No results for input(s): LABPT, INR in the last 72 hours.  EXAM General - Patient is Alert, Appropriate and Oriented Extremity - Neurologically intact Neurovascular intact Sensation intact distally Intact pulses distally Dorsiflexion/Plantar flexion intact Compartment soft Dressing - dressing C/D/I Motor Function - intact, moving foot and toes well on exam. Able to do SLR on own.sensory and motor function are back to nl  Past Medical History  Diagnosis Date  . Irritable bowel syndrome (IBS)     welchol has improved bowel problems  . Hyperlipidemia   . Anxiety   . GERD (gastroesophageal reflux disease)     occasionally  . Cancer ([place])     skin  . Complication of anesthesia     heart block during surgery around 2014  poss r/t lithium  . PONV (postoperative nausea and vomiting)   . 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   Weight as of this encounter: 108.863 kg (240 lb). Advance diet Up with therapy D/C IV fluids Plan for discharge tomorrow Discharge home with home health  Labs: reviewed DVT Prophylaxis - Lovenox, Foot Pumps and TED hose Weight-Bearing as tolerated to left leg D/C O2 and Pulse OX and try on Room Air Begin working on bowel movement Labs tomorrow morning  Brielynn Sekula R. [place] 06/16/2015, 6:47 AM

## 2015-06-16 NOTE — Evaluation (Signed)
Physical Therapy Evaluation Patient Details Name: Travis Mills. MRN: [id number] DOB: June 04, 1945 Today's Date: 06/16/2015   History of Present Illness  Pt is a 70 y.o. M s/p L TKA on 06/15/15. Pt has hx of previous TKA on R LE performed approx 9 years ago per pt.    Clinical Impression  Pt is a pleasant 70 y.o. M POD #1 following a L TKA. Prior to admission, pt lived at home with wife who is available to assist him t/o the day. Pt performed all ADLs independently and did not use an AD for ambulation. Pt awake and oriented during evaluation. Pt demonstrated good B UE and LE strength. Pt able to perform bed mobility with use of bed railings and min assist. Pt able to transfer from EOB using RW and min assist. Pt able to ambulate approx 8 using RW and min assist. Pt performed supine there-ex on L LE with min to no assist. Pt able to perform SLR on L LE with no assist x10, indicating pt does not need KI. Pt demonstrates deficits in strength, ROM, and mobility. Pt would benefit from further skilled PT to address deficits and return to PLOF. Recommend pt receive home health PT after discharge from acute hospitalization.     Follow Up Recommendations Home health PT    Equipment Recommendations       Recommendations for Other Services       Precautions / Restrictions Precautions Precautions: Knee;Fall Precaution Booklet Issued: No Restrictions Weight Bearing Restrictions: Yes LLE Weight Bearing: Weight bearing as tolerated      Mobility  Bed Mobility Overal bed mobility: Needs Assistance Bed Mobility: Supine to Sit     Supine to sit: Min assist     General bed mobility comments: Pt able to perform bed mobility with use of bed railings and min assist. Pt required assist with moving L LE and scooting to EOB. Pt required increased time to place L LE on floor.   Transfers Overall transfer level: Needs assistance Equipment used: Rolling walker (2 wheeled) Transfers: Sit to/from  Stand Sit to Stand: Min assist         General transfer comment: Pt able to transfer from EOB using RW and min assist. Pt provided cues regarding hand and LE placement prior to standing. Pt required increased time to upright posture and maintain balance with WB precautions. Pt educated on WB restrictions prior to transfer.   Ambulation/Gait Ambulation/Gait assistance: Min assist Ambulation Distance (Feet): 8 Feet Assistive device: Rolling walker (2 wheeled) Gait Pattern/deviations: Step-to pattern Gait velocity: decreased   General Gait Details: Pt able to ambulate in room approx 8 ft using RW and min assist. Pt demonstrated slow step-to gait pattern with short steps. Pt provided cues regarding foot and walker placement. Pt maintained upright posture t/o ambulation.   Stairs            Wheelchair Mobility    Modified Rankin (Stroke Patients Only)       Balance Overall balance assessment: Modified Independent (able to maintain standing balance w/RW)                                           Pertinent Vitals/Pain Pain Assessment: 0-10 Pain Score: 3  Pain Location: R knee Pain Descriptors / Indicators: Operative site guarding Pain Intervention(s): Limited activity within patient's tolerance    Home Living Family/patient  expects to be discharged to:: Private residence Living Arrangements: Spouse/significant other Available Help at Discharge: Family Type of Home: House Home Access: Stairs to enter Entrance Stairs-Rails: Left Entrance Stairs-Number of Steps: [place]: One level Home Equipment: [place] - 2 wheels;Bedside commode;Grab bars - tub/shower Additional Comments: Pt lives at home with wife who is available throughout the day to assist him.     Prior Function Level of Independence: Independent         Comments: Pt performed all ADLs independently. Pt did not ambulate with AD, other than a RW following previous TKA.      Hand  Dominance        Extremity/Trunk Assessment   Upper Extremity Assessment: RUE deficits/detail;LUE deficits/detail RUE Deficits / Details: R UE grossly 4+/5 strength     LUE Deficits / Details: L UE grossly 4+/5 strength   Lower Extremity Assessment: RLE deficits/detail;LLE deficits/detail RLE Deficits / Details: R LE grossly 5/5 strength LLE Deficits / Details: L LE grossly 4/5 strength     Communication   Communication: No difficulties  Cognition Arousal/Alertness: Awake/alert Behavior During Therapy: WFL for tasks assessed/performed Overall Cognitive Status: Within Functional Limits for tasks assessed                      General Comments      Exercises Total Joint Exercises Goniometric ROM: L Knee AROM: 4 - 81 degrees  Other Exercises Other Exercises: Pt performed supine ther-ex on L LE including SLR, ankle pumps, and quad sets with no assist, and SAQ and seated heel slides with min assist. All ther-ex performed x10 reps.       Assessment/Plan    PT Assessment Patient needs continued PT services  PT Diagnosis Difficulty walking;Abnormality of gait;Acute pain   PT Problem List Decreased strength;Decreased range of motion;Decreased mobility;Decreased knowledge of use of DME  PT Treatment Interventions DME instruction;Gait training;Stair training;Therapeutic activities;Therapeutic exercise   PT Goals (Current goals can be found in the Care Plan section) Acute Rehab PT Goals Patient Stated Goal: to return to PLOF PT Goal Formulation: With patient Time For Goal Achievement: 06/30/15 Potential to Achieve Goals: Good    Frequency BID   Barriers to discharge        Co-evaluation               End of Session Equipment Utilized During Treatment: Gait belt Activity Tolerance: Patient tolerated treatment well Patient left: in chair;with call bell/phone within reach;with chair alarm set;with SCD's reapplied Nurse Communication: Mobility status          Time: [id number] PT Time Calculation (min) (ACUTE ONLY): 28 min   Charges:         PT G Codes:        Sherral Hammers 06-23-2015, 10:50 AM M. Barnett Abu, SPT

## 2015-06-16 NOTE — Anesthesia Postprocedure Evaluation (Signed)
Anesthesia Post Note  Patient: Travis Mills.  Procedure(s) Performed: Procedure(s) (LRB): COMPUTER ASSISTED TOTAL KNEE ARTHROPLASTY (Left)  Patient location during evaluation: Other Anesthesia Type: Spinal and MAC Level of consciousness: awake, awake and alert and oriented Pain management: satisfactory to patient Vital Signs Assessment: post-procedure vital signs reviewed and stable Respiratory status: spontaneous breathing and nonlabored ventilation Cardiovascular status: stable Postop Assessment: no headache, no signs of nausea or vomiting and adequate PO intake    Last Vitals:  Filed Vitals:   06/15/15 1946 06/16/15 0345  BP: 119/65 103/66  Pulse: 69 77  Temp: 36.6 C 36.8 C  Resp: 18 18    Last Pain:  Filed Vitals:   06/16/15 0345  PainSc: 2                  Madelon Welsch,  [place]

## 2015-06-16 NOTE — Progress Notes (Signed)
Physical Therapy Treatment Patient Details Name: Travis Mills. MRN: [id number] DOB: Jul 07, 1945 Today's Date: 06/16/2015    History of Present Illness Pt is a 70 y.o. M s/p L TKA on 06/15/15. Pt has hx of previous TKA on R LE performed approx 9 years ago per pt.      PT Comments    Pt up in chair ready for session.  Stood with min guard x 1 with good balance.  Pt was able to ambulate on unit 120' with min guard with steady gait.  He was able to return to supine with supervision.  Participated in seated and supine exercises as described below.  Progressing well with mobility skills.    Follow Up Recommendations  Home health PT     Equipment Recommendations       Recommendations for Other Services       Precautions / Restrictions Precautions Precautions: Knee;Fall Precaution Booklet Issued: No Restrictions Weight Bearing Restrictions: Yes LLE Weight Bearing: Weight bearing as tolerated    Mobility  Bed Mobility Overal bed mobility: Needs Assistance Bed Mobility: Supine to Sit     Supine to sit: Min assist     General bed mobility comments: Pt able to perform bed mobility with use of bed railings and min assist. Pt required assist with moving L LE and scooting to EOB. Pt required increased time to place L LE on floor.   Transfers Overall transfer level: Needs assistance Equipment used: Rolling walker (2 wheeled) Transfers: Sit to/from Stand Sit to Stand: Min guard         General transfer comment: Pt able to transfer from EOB using RW and min assist. Pt provided cues regarding hand and LE placement prior to standing. Pt required increased time to upright posture and maintain balance with WB precautions. Pt educated on WB restrictions prior to transfer.   Ambulation/Gait Ambulation/Gait assistance: Min guard Ambulation Distance (Feet): 120 Feet Assistive device: Rolling walker (2 wheeled) Gait Pattern/deviations: Step-through pattern (uneven) Gait velocity:  decreased Gait velocity interpretation: <1.8 ft/sec, indicative of risk for recurrent falls General Gait Details: increased distances this pm   Stairs            Wheelchair Mobility    Modified Rankin (Stroke Patients Only)       Balance Overall balance assessment: Modified Independent                                  Cognition Arousal/Alertness: Awake/alert Behavior During Therapy: WFL for tasks assessed/performed Overall Cognitive Status: Within Functional Limits for tasks assessed                      Exercises Total Joint Exercises Ankle Circles/Pumps: AROM;Left;10 reps;Supine Quad Sets: AROM;Left;10 reps;Supine Heel Slides: AROM;Left;10 reps;Supine Straight Leg Raises: AROM;Left;10 reps;Supine Long Arc Quad: AROM;Left;10 reps;Seated Knee Flexion: AROM;Left;10 reps;Seated Goniometric ROM: L Knee AROM: 4 - 81 degrees  Other Exercises Other Exercises: Pt performed supine ther-ex on L LE including SLR, ankle pumps, and quad sets with no assist, and SAQ and seated heel slides with min assist. All ther-ex performed x10 reps.     General Comments        Pertinent Vitals/Pain Pain Assessment: 0-10 Pain Score: 3  Pain Location: R Knee Pain Descriptors / Indicators: Sore Pain Intervention(s): Limited activity within patient's tolerance;Ice applied;RN gave pain meds during session    Home Living Family/patient expects to  be discharged to:: Private residence Living Arrangements: Spouse/significant other Available Help at Discharge: Family Type of Home: House Home Access: Stairs to enter Entrance Stairs-Rails: Left Home Layout: One level Home Equipment: Environmental consultant - 2 wheels;Bedside commode;Grab bars - tub/shower Additional Comments: Pt lives at home with wife who is available throughout the day to assist him.     Prior Function Level of Independence: Independent      Comments: Pt performed all ADLs independently. Pt did not ambulate with  AD, other than a RW following previous TKA.    PT Goals (current goals can now be found in the care plan section) Acute Rehab PT Goals Patient Stated Goal: to return to PLOF PT Goal Formulation: With patient Time For Goal Achievement: 06/30/15 Potential to Achieve Goals: Good    Frequency  BID    PT Plan      Co-evaluation             End of Session Equipment Utilized During Treatment: Gait belt Activity Tolerance: Patient tolerated treatment well Patient left: in bed;with call bell/phone within reach     Time: 1200-1229 PT Time Calculation (min) (ACUTE ONLY): 29 min  Charges:  $Gait Training: 8-22 mins $Therapeutic Exercise: 8-22 mins                    G Codes:      Chesley Noon, PTA 06/16/2015, 12:45 PM

## 2015-06-16 NOTE — Evaluation (Signed)
Occupational Therapy Evaluation Patient Details Name: Claron Cappell. MRN: [id number] DOB: 06-10-1945 Today's Date: 06/16/2015    History of Present Illness  Pt. Is a 70 y.o. Male who was admitted for a Left TKR   Clinical Impression   Pt. Is a 70 y.o. Male who was admitted for a Left TKR. Pt presents with limited ROM, Pain, weakness, and impaired functional mobility which hinder his ability to complete ADL and IADL tasks. Pt. could benefit from skilled OT services to review A/E use for LE ADLs, to review necessary home modifications, and to improve functional mobility for ADL/IADLs in order to work towards regaining Independence with ADL/IADLs. Pt. Plans to return home upon discharge.    Follow Up Recommendations  No OT follow up    Equipment Recommendations       Recommendations for Other Services       Precautions / Restrictions        Mobility Bed Mobility                  Transfers Overall transfer level: Needs assistance Equipment used: Rolling walker (2 wheeled) Transfers: Sit to/from Stand Sit to Stand: Supervision              Balance                                            ADL Overall ADL's : Needs assistance/impaired Eating/Feeding: Independent   Grooming: Independent               Lower Body Dressing: Minimal assistance with A/E use.                       Vision     Perception     Praxis      Pertinent Vitals/Pain       Hand Dominance     Extremity/Trunk Assessment Upper Extremity Assessment Upper Extremity Assessment: Overall WFL for tasks assessed           Communication Communication Communication: No difficulties   Cognition                           General Comments       Exercises       Shoulder Instructions      Home Living Family/patient expects to be discharged to:: Private residence Living Arrangements: Spouse/significant other Available Help at  Discharge: Family Type of Home: House Home Access: Stairs to enter Technical brewer of Steps: 5 Entrance Stairs-Rails: Left Home Layout: One level     Bathroom Shower/Tub: Occupational psychologist: Standard Bathroom Accessibility: Yes How Accessible: Accessible via walker            Prior Functioning/Environment Level of Independence: Independent             OT Diagnosis: Generalized weakness;Acute pain   OT Problem List: Decreased strength;Decreased range of motion;Decreased activity tolerance;Decreased knowledge of use of DME or AE;Pain   OT Treatment/Interventions: Self-care/ADL training;Therapeutic exercise;DME and/or AE instruction;Therapeutic activities;Patient/family education    OT Goals(Current goals can be found in the care plan section) Acute Rehab OT Goals Patient Stated Goal: To regain independence OT Goal Formulation: With patient Time For Goal Achievement: 06/30/15 Potential to Achieve Goals: Good  OT Frequency: Min 1X/week   Barriers to D/C:  Co-evaluation              End of Session Equipment Utilized During Treatment: Gait belt;Rolling walker  Activity Tolerance: Patient tolerated treatment well Patient left: in chair;with call bell/phone within reach;with chair alarm set   Time: [id number] OT Time Calculation (min): 29 min Charges:  OT General Charges $OT Visit: 1 Procedure OT Evaluation $OT Eval Moderate Complexity: 1 Procedure OT Treatments $Self Care/Home Management : 8-22 mins G-Codes:    Harrel Carina, MS, OTR/L Harrel Carina 06/16/2015, 10:28 AM

## 2015-06-17 LAB — BASIC METABOLIC PANEL
ANION GAP: 8 (ref 5–15)
BUN: 12 mg/dL (ref 6–20)
CALCIUM: 8.7 mg/dL — AB (ref 8.9–10.3)
CHLORIDE: 108 mmol/L (ref 101–111)
CO2: 24 mmol/L (ref 22–32)
Creatinine, Ser: 0.77 mg/dL (ref 0.61–1.24)
GFR calc non Af Amer: >60 mL/min (ref >60)
GLUCOSE: 126 mg/dL — AB (ref 65–99)
Potassium: 4 mmol/L (ref 3.5–5.1)
Sodium: 140 mmol/L (ref 135–145)

## 2015-06-17 LAB — CBC
HEMATOCRIT: 39.1 % — AB (ref 40.0–52.0)
HEMOGLOBIN: 13.4 g/dL (ref 13.0–18.0)
MCH: 33 pg (ref 26.0–34.0)
MCHC: 34.3 g/dL (ref 32.0–36.0)
MCV: 96.1 fL (ref 80.0–100.0)
Platelets: 217 10*3/uL (ref 150–440)
RBC: 4.07 MIL/uL — ABNORMAL LOW (ref 4.40–5.90)
RDW: 13.8 % (ref 11.5–14.5)
WBC: 10.6 10*3/uL (ref 3.8–10.6)

## 2015-06-17 MED ORDER — TRANEXAMIC ACID 1000 MG/10ML IV SOLN
1000.0000 mg | INTRAVENOUS | Status: DC | PRN
  Administered 2015-06-15: 1500 mg via INTRAVENOUS

## 2015-06-17 NOTE — Progress Notes (Signed)
Physical Therapy Treatment Patient Details Name: Travis Mills. MRN: [id number] DOB: Jul 04, 1945 Today's Date: 06/17/2015    History of Present Illness Pt is a 69 y.o. M s/p L TKA on 06/15/15. Pt has hx of previous TKA on R LE performed approx 9 years ago per pt.      PT Comments    Pt is progressing towards goals, however pt limited today by pain. Pt stated he has fever the night before and did not sleep well. Pt able to perform bed mobility with use of bed railings and min A. Pt performed transfer from EOB with RW and min A. Pt ambulated approx 120 ft with RW and CGA. Pt performed stair mobility with use of L hand rail and min assist. Pt became fatigued/lightheaded with stair training and required a rest break before ambulating back to room. Pt performed supine and seated there-ex on L LE with min to no assist. Pt demonstrates deficits in strength, ROM and mobility. Pt would benefit from further skilled PT to address deficits and return to PLOF; recommend pt receive home health PT after discharge from acute hospitalization.   Follow Up Recommendations  Home health PT     Equipment Recommendations       Recommendations for Other Services       Precautions / Restrictions Precautions Precautions: Knee;Fall Precaution Booklet Issued: Yes (comment) Restrictions Weight Bearing Restrictions: Yes LLE Weight Bearing: Weight bearing as tolerated    Mobility  Bed Mobility Overal bed mobility: Needs Assistance Bed Mobility: Supine to Sit     Supine to sit: Min assist     General bed mobility comments: Pt able to perform bed mobility with use of bed railings and min assist. Pt required assist with moving L LE and scooting to EOB. Pt required increased time to place L LE on floor.   Transfers Overall transfer level: Needs assistance Equipment used: Rolling walker (2 wheeled) Transfers: Sit to/from Stand Sit to Stand: Min assist         General transfer comment: Pt able to  transfer from EOB using RW and min assist. Pt required increased time to stand and upright posture. Pt provided cues regading hand placement during transfer. Pt able to maintain WB status and balance once standing.   Ambulation/Gait Ambulation/Gait assistance: Min guard Ambulation Distance (Feet): 120 Feet Assistive device: Rolling walker (2 wheeled) Gait Pattern/deviations: Step-through pattern Gait velocity: decreased   General Gait Details: Pt able to ambulate aprpox 120 ft with use of RW and CGA. Pt demonstrated step-through gait pattern with slow gait speed. Pt provided cues regarding walker and foot placement during ambulation. Pt able to maintain upright posture while ambulating.    Stairs Stairs: Yes Stairs assistance: Min assist Stair Management: One rail Left Number of Stairs: 4 General stair comments: Pt able to perform stair mobility up/down 4 steps with step-to gait pattern using L railing. Pt provided cues regarding hand and foot placement during stair mobility. Pt became fatigued/lightheaded and requested to sit down after performed stairs. Pt rested for approx 7-10 minutes before continuing ambulation.   Wheelchair Mobility    Modified Rankin (Stroke Patients Only)       Balance                                    Cognition Arousal/Alertness: Awake/alert Behavior During Therapy: WFL for tasks assessed/performed Overall Cognitive Status: Within Functional Limits for  tasks assessed                      Exercises Total Joint Exercises Goniometric ROM: L Knee AAROM: 4 - 85 degrees  Other Exercises Other Exercises: Pt performed supine ther-ex on L LE including SLR, ankle pumps, quad sets with no assist and hip ab/ad and seated flexion with min assist. All ther-ex performed x12 reps. Pt provided HEP packet.     General Comments        Pertinent Vitals/Pain Pain Assessment: 0-10 Pain Score: 5  Pain Location: R knee Pain Descriptors /  Indicators: Operative site guarding Pain Intervention(s): Limited activity within patient's tolerance    Home Living                      Prior Function            PT Goals (current goals can now be found in the care plan section) Acute Rehab PT Goals Patient Stated Goal: to return to PLOF PT Goal Formulation: With patient Time For Goal Achievement: 06/30/15 Potential to Achieve Goals: Good Progress towards PT goals: Progressing toward goals    Frequency  BID    PT Plan Current plan remains appropriate    Co-evaluation             End of Session Equipment Utilized During Treatment: Gait belt Activity Tolerance: Patient tolerated treatment well Patient left: in chair;with call bell/phone within reach;with chair alarm set;with nursing/sitter in room     Time: 0800-0839 PT Time Calculation (min) (ACUTE ONLY): 39 min  Charges:                       G Codes:      Sherral Hammers Jul 16, 2015, 12:09 PM M. Barnett Abu, SPT

## 2015-06-17 NOTE — Discharge Summary (Signed)
Physician Discharge Summary  Subjective: 2 Days Post-Op Procedure(s) (LRB): COMPUTER ASSISTED TOTAL KNEE ARTHROPLASTY (Left) Patient reports pain as mild.   Patient seen in rounds with Dr. Marry Guan. Patient is well, and has had no acute complaints or problems Patient is ready to go home with home health physical therapy.  Physician Discharge Summary  Patient ID: Travis Mills. MRN: [id number] DOB/AGE: 1945-07-14 70 y.o.  Admit date: 06/15/2015 Discharge date: 06/17/2015  Admission Diagnoses:  Discharge Diagnoses:  Active Problems:   S/P total knee arthroplasty   Discharged Condition: fair  Hospital Course: The patient did well after surgery. He ambulated 150 feet on postop day 1. He is comfortable in the bed on postop day 2. The patient had a dressing change and the Hemovac removed with no complication. He did have a mild low-grade fever on the second night. The patient is ready to go home with home health physical therapy today.  Treatments: surgery:  PROCEDURE: Left total knee arthroplasty using computer-assisted navigation  SURGEON: Marciano Sequin. M.D.  ASSISTANT: Vance Peper, PA (present and scrubbed throughout the case, critical for assistance with exposure, retraction, instrumentation, and closure)  ANESTHESIA: spinal  ESTIMATED BLOOD LOSS: 50 mL  FLUIDS REPLACED: 1400 mL of crystalloid  TOURNIQUET TIME: 91 minutes  DRAINS: 2 medium drains to a reinfusion system  SOFT TISSUE RELEASES: Anterior cruciate ligament, posterior cruciate ligament, deep medial collateral ligament, patellofemoral ligament   IMPLANTS UTILIZED: DePuy PFC Sigma size 4 posterior stabilized femoral component (cemented), size 5 MBT tibial component (cemented), 38 mm 3 peg oval dome patella (cemented), and a 10 mm stabilized rotating platform polyethylene insert.   Discharge Exam: Blood pressure 122/84, pulse 88, temperature 98.3 F (36.8 C), temperature source Oral, resp. rate 16,  height 6' (1.829 m), weight 108.863 kg (240 lb), SpO2 95 %.   Disposition:   Discharge Instructions    Diet - low sodium heart healthy    Complete by:  As directed      Increase activity slowly    Complete by:  As directed             Medication List    STOP taking these medications        aspirin 81 MG tablet     ibuprofen 800 MG tablet  Commonly known as:  ADVIL,MOTRIN      TAKE these medications        colesevelam 625 MG tablet  Commonly known as:  WELCHOL  Take 625 mg by mouth daily.     enoxaparin 40 MG/0.4ML injection  Commonly known as:  LOVENOX  Inject 0.4 mLs (40 mg total) into the skin daily.     oxyCODONE 5 MG immediate release tablet  Commonly known as:  Oxy IR/ROXICODONE  Take 1-2 tablets (5-10 mg total) by mouth every 4 (four) hours as needed for severe pain or breakthrough pain.     pravastatin 20 MG tablet  Commonly known as:  PRAVACHOL  Take 20 mg by mouth every evening.     sertraline 100 MG tablet  Commonly known as:  ZOLOFT  Take 100 mg by mouth every evening.     traMADol 50 MG tablet  Commonly known as:  ULTRAM  Take 1-2 tablets (50-100 mg total) by mouth every 4 (four) hours as needed for moderate pain.           Follow-up Information    Follow up with [facility] R., PA On 06/28/2015.   Specialty:  Physician  Assistant   Why:  at 2:15pm   Contact information:   [address] [place] [address] [phone]       Follow up with Dereck Leep, MD On 07/26/2015.   Specialty:  Orthopedic Surgery   Why:  at 10:45am   Contact information:   [place] [address] [phone]       Signed: Prescott Parma, TODD 06/17/2015, 6:17 AM   Objective: Vital signs in last 24 hours: Temp:  [98.1 F (36.7 C)-100.1 F (37.8 C)] 98.3 F (36.8 C) (05/05 0414) Pulse Rate:  [88-101] 88 (05/05 0414) Resp:  [16-17] 16 (05/05 0414) BP: (122-149)/(72-85) 122/84 mmHg  (05/05 0414) SpO2:  [94 %-95 %] 95 % (05/05 0414)  Intake/Output from previous day:  Intake/Output Summary (Last 24 hours) at 06/17/15 0617 Last data filed at 06/17/15 0400  Gross per 24 hour  Intake    480 ml  Output   2145 ml  Net  -1665 ml    Intake/Output this shift: Total I/O In: -  Out: 1200 [Urine:1100; Drains:100]  Labs:  Recent Labs  06/16/15 0352 06/17/15 0329  HGB 12.4* 13.4    Recent Labs  06/16/15 0352 06/17/15 0329  WBC 7.7 10.6  RBC 3.68* 4.07*  HCT 35.0* 39.1*  PLT 198 217    Recent Labs  06/16/15 0352 06/17/15 0329  NA 135 140  K 3.6 4.0  CL 105 108  CO2 22 24  BUN 14 12  CREATININE 0.81 0.77  GLUCOSE 103* 126*  CALCIUM 7.9* 8.7*   No results for input(s): LABPT, INR in the last 72 hours.  EXAM: General - Patient is Alert and Oriented Extremity - Sensation intact distally Intact pulses distally No cellulitis present Compartment soft Incision - clean, dry, blood tinged and scant drainage Motor Function -  the patient is able to do a straight leg raise. He can dorsiflex and plantarflex fully. The patient ambulated 150 feet.  Assessment/Plan: 2 Days Post-Op Procedure(s) (LRB): COMPUTER ASSISTED TOTAL KNEE ARTHROPLASTY (Left) Procedure(s) (LRB): COMPUTER ASSISTED TOTAL KNEE ARTHROPLASTY (Left) Past Medical History  Diagnosis Date  . Irritable bowel syndrome (IBS)     welchol has improved bowel problems  . Hyperlipidemia   . Anxiety   . GERD (gastroesophageal reflux disease)     occasionally  . Cancer ([place])     skin  . Complication of anesthesia     heart block during surgery around 2014 poss r/t lithium  . PONV (postoperative nausea and vomiting)   . 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   Weight as of this  encounter: 108.863 kg (240 lb). Discharge home with home health Diet - Regular diet Follow up - in 2 weeks Activity - WBAT Disposition - Home Condition Upon Discharge - Good DVT Prophylaxis - Lovenox and TED hose  Reche Dixon, PA-C Orthopaedic Surgery 06/17/2015, 6:17 AM

## 2015-06-17 NOTE — Discharge Instructions (Signed)

## 2015-06-17 NOTE — Progress Notes (Signed)
Pt had 2 small bowel movements. Per MD, ok to discharge. Educated patient to continue with increased fluids, and to take stool softener or laxative to avoid constipation. Also educated on diet. Pt verbalized understanding as well as wife. Pt states he will increase his fluids drink prune juice and salads.

## 2015-06-17 NOTE — Progress Notes (Signed)
Patient discharged home. DC instructions provided and explained. Meds reviewed. Rx given. All questions answered. Pt stable at discharge

## 2015-06-17 NOTE — Care Management (Signed)
Patient discharging to home today. I have notified [place] home [facility]. No other RNCM needs. Case closed.

## 2015-06-17 NOTE — Progress Notes (Signed)
   Subjective: 2 Days Post-Op Procedure(s) (LRB): COMPUTER ASSISTED TOTAL KNEE ARTHROPLASTY (Left) Patient reports pain as 3 on 0-10 scale.   Patient is well, and has had no acute complaints or problems The patient has done well with physical therapy and has ambulated 150 feet. Plan is to go Home after hospital stay. The plan is to go home today. no nausea and no vomiting. The patient has not had a bowel movement. Patient denies any chest pains or shortness of breath.  Objective: Vital signs in last 24 hours: Temp:  [98.1 F (36.7 C)-100.1 F (37.8 C)] 98.3 F (36.8 C) (05/05 0414) Pulse Rate:  [88-101] 88 (05/05 0414) Resp:  [16-17] 16 (05/05 0414) BP: (122-149)/(72-85) 122/84 mmHg (05/05 0414) SpO2:  [94 %-95 %] 95 % (05/05 0414) Heels are non tender and elevated off the bed using rolled towels Intake/Output from previous day: 05/04 0701 - 05/05 0700 In: 480 [P.O.:480] Out: 1645 [Urine:1425; Drains:220] Intake/Output this shift: Total I/O In: -  Out: 1200 [Urine:1100; Drains:100]   Recent Labs  06/16/15 0352 06/17/15 0329  HGB 12.4* 13.4    Recent Labs  06/16/15 0352 06/17/15 0329  WBC 7.7 10.6  RBC 3.68* 4.07*  HCT 35.0* 39.1*  PLT 198 217    Recent Labs  06/16/15 0352 06/17/15 0329  NA 135 140  K 3.6 4.0  CL 105 108  CO2 22 24  BUN 14 12  CREATININE 0.81 0.77  GLUCOSE 103* 126*  CALCIUM 7.9* 8.7*   No results for input(s): LABPT, INR in the last 72 hours.  EXAM General - Patient is Alert, Appropriate and Oriented Extremity - Neurologically intact Neurovascular intact Sensation intact distally Intact pulses distally Dorsiflexion/Plantar flexion intact Compartment soft Dressing - dressing C/D/I. The external bandage was removed as well as the Hemovac with no complication. Motor Function - intact, moving foot and toes well on exam. Able to do SLR on own.sensory and motor function are back to nl  Past Medical History  Diagnosis Date  .  Irritable bowel syndrome (IBS)     welchol has improved bowel problems  . Hyperlipidemia   . Anxiety   . GERD (gastroesophageal reflux disease)     occasionally  . Cancer ([place])     skin  . Complication of anesthesia     heart block during surgery around 2014 poss r/t lithium  . PONV (postoperative nausea and vomiting)   . 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   Weight as of this encounter: 108.863 kg (240 lb). Continue physical therapy today. Plan to go home today after physical therapy and bowel movement.  Labs: reviewed DVT Prophylaxis - Lovenox, Foot Pumps and TED hose Weight-Bearing as tolerated to left leg D/C O2 and Pulse OX and try on Room Air Continue working on bowel movement   Reche Dixon PA-C [place] 06/17/2015, 6:14 AM

## 2016-03-20 ENCOUNTER — Encounter: Payer: Self-pay | Admitting: *Deleted

## 2016-04-03 NOTE — H&P (Signed)
See scanned note.

## 2016-04-04 ENCOUNTER — Ambulatory Visit: Payer: Medicare HMO | Admitting: Anesthesiology

## 2016-04-04 ENCOUNTER — Ambulatory Visit
Admission: RE | Admit: 2016-04-04 | Discharge: 2016-04-04 | Disposition: A | Discharge status: Home / Self Care | Payer: Medicare HMO | Source: Ambulatory Visit | Attending: Ophthalmology | Admitting: Ophthalmology

## 2016-04-04 ENCOUNTER — Encounter: Payer: Self-pay | Admitting: *Deleted

## 2016-04-04 ENCOUNTER — Encounter
Admission: RE | Disposition: A | Discharge status: Home / Self Care | Payer: Self-pay | Source: Ambulatory Visit | Attending: Ophthalmology

## 2016-04-04 DIAGNOSIS — H2512 Age-related nuclear cataract, left eye: Secondary | ICD-10-CM | POA: Insufficient documentation

## 2016-04-04 HISTORY — DX: Cough, unspecified: R05.9

## 2016-04-04 HISTORY — DX: Major depressive disorder, single episode, unspecified: F32.9

## 2016-04-04 HISTORY — PX: CATARACT EXTRACTION W/PHACO: SHX586

## 2016-04-04 HISTORY — DX: Sleep apnea, unspecified: G47.30

## 2016-04-04 HISTORY — DX: Unspecified hearing loss, unspecified ear: H91.90

## 2016-04-04 HISTORY — DX: Cough: R05

## 2016-04-04 HISTORY — DX: Depression, unspecified: F32.A

## 2016-04-04 SURGERY — PHACOEMULSIFICATION, CATARACT, WITH IOL INSERTION
Anesthesia: General | Site: Eye | Laterality: Left | Wound class: Clean

## 2016-04-04 MED ORDER — HYALURONIDASE HUMAN 150 UNIT/ML IJ SOLN
INTRAMUSCULAR | Status: AC
  Filled 2016-04-04: qty 1

## 2016-04-04 MED ORDER — PHENYLEPHRINE HCL 10 % OP SOLN
1.0000 [drp] | OPHTHALMIC | Status: AC
  Administered 2016-04-04 (×4): 1 [drp] via OPHTHALMIC

## 2016-04-04 MED ORDER — MOXIFLOXACIN HCL 0.5 % OP SOLN
OPHTHALMIC | Status: DC | PRN
  Administered 2016-04-04: 1 [drp] via OPHTHALMIC

## 2016-04-04 MED ORDER — NA CHONDROIT SULF-NA HYALURON 40-17 MG/ML IO SOLN
INTRAOCULAR | Status: AC
  Filled 2016-04-04: qty 1

## 2016-04-04 MED ORDER — CEFUROXIME OPHTHALMIC INJECTION 1 MG/0.1 ML
INJECTION | OPHTHALMIC | Status: AC
  Filled 2016-04-04: qty 0.1

## 2016-04-04 MED ORDER — MIDAZOLAM HCL 2 MG/2ML IJ SOLN
INTRAMUSCULAR | Status: AC
  Filled 2016-04-04: qty 2

## 2016-04-04 MED ORDER — ALFENTANIL 500 MCG/ML IJ INJ
INJECTION | INTRAMUSCULAR | Status: DC | PRN
  Administered 2016-04-04: 250 ug via INTRAVENOUS
  Administered 2016-04-04: 500 ug via INTRAVENOUS

## 2016-04-04 MED ORDER — EPINEPHRINE PF 1 MG/ML IJ SOLN
INTRAOCULAR | Status: DC | PRN
  Administered 2016-04-04: 1 mL via OPHTHALMIC

## 2016-04-04 MED ORDER — EPINEPHRINE PF 1 MG/ML IJ SOLN
INTRAMUSCULAR | Status: AC
  Filled 2016-04-04: qty 1

## 2016-04-04 MED ORDER — LIDOCAINE HCL (PF) 4 % IJ SOLN
INTRAOCULAR | Status: DC | PRN
  Administered 2016-04-04: 2.25 mL via OPHTHALMIC

## 2016-04-04 MED ORDER — LIDOCAINE HCL (PF) 4 % IJ SOLN
INTRAMUSCULAR | Status: DC | PRN
  Administered 2016-04-04: 4 mL via OPHTHALMIC

## 2016-04-04 MED ORDER — MIDAZOLAM HCL 2 MG/2ML IJ SOLN
INTRAMUSCULAR | Status: DC | PRN
  Administered 2016-04-04: 1 mg via INTRAVENOUS

## 2016-04-04 MED ORDER — CYCLOPENTOLATE HCL 2 % OP SOLN
OPHTHALMIC | Status: AC
  Administered 2016-04-04: 1 [drp] via OPHTHALMIC
  Filled 2016-04-04: qty 2

## 2016-04-04 MED ORDER — CARBACHOL 0.01 % IO SOLN
INTRAOCULAR | Status: DC | PRN
  Administered 2016-04-04: .5 mL via INTRAOCULAR

## 2016-04-04 MED ORDER — FENTANYL CITRATE (PF) 100 MCG/2ML IJ SOLN: 25.0000 ug | INTRAMUSCULAR | Status: DC | PRN

## 2016-04-04 MED ORDER — POVIDONE-IODINE 5 % OP SOLN
OPHTHALMIC | Status: DC | PRN
  Administered 2016-04-04: 1 via OPHTHALMIC

## 2016-04-04 MED ORDER — NA CHONDROIT SULF-NA HYALURON 40-17 MG/ML IO SOLN
INTRAOCULAR | Status: DC | PRN
  Administered 2016-04-04: 1 mL via INTRAOCULAR

## 2016-04-04 MED ORDER — ONDANSETRON HCL 4 MG/2ML IJ SOLN: 4.0000 mg | Freq: Once | INTRAMUSCULAR | Status: DC | PRN

## 2016-04-04 MED ORDER — PHENYLEPHRINE HCL 10 % OP SOLN
OPHTHALMIC | Status: AC
  Administered 2016-04-04: 1 [drp] via OPHTHALMIC
  Filled 2016-04-04: qty 5

## 2016-04-04 MED ORDER — CEFUROXIME OPHTHALMIC INJECTION 1 MG/0.1 ML
INJECTION | OPHTHALMIC | Status: DC | PRN
  Administered 2016-04-04: .1 mL via INTRACAMERAL

## 2016-04-04 MED ORDER — POVIDONE-IODINE 5 % OP SOLN
OPHTHALMIC | Status: AC
  Filled 2016-04-04: qty 30

## 2016-04-04 MED ORDER — SODIUM CHLORIDE 0.9 % IV SOLN
INTRAVENOUS | Status: DC
  Administered 2016-04-04 (×2): via INTRAVENOUS

## 2016-04-04 MED ORDER — CYCLOPENTOLATE HCL 2 % OP SOLN
1.0000 [drp] | OPHTHALMIC | Status: AC
  Administered 2016-04-04 (×4): 1 [drp] via OPHTHALMIC

## 2016-04-04 MED ORDER — MOXIFLOXACIN HCL 0.5 % OP SOLN
1.0000 [drp] | OPHTHALMIC | Status: DC
  Administered 2016-04-04 (×3): 1 [drp] via OPHTHALMIC

## 2016-04-04 MED ORDER — MOXIFLOXACIN HCL 0.5 % OP SOLN
OPHTHALMIC | Status: AC
  Administered 2016-04-04: 1 [drp] via OPHTHALMIC
  Filled 2016-04-04: qty 3

## 2016-04-04 MED ORDER — TETRACAINE HCL 0.5 % OP SOLN
OPHTHALMIC | Status: DC | PRN
  Administered 2016-04-04: 1 [drp] via OPHTHALMIC

## 2016-04-04 MED ORDER — TETRACAINE HCL 0.5 % OP SOLN
OPHTHALMIC | Status: AC
  Filled 2016-04-04: qty 2

## 2016-04-04 MED ORDER — BUPIVACAINE HCL (PF) 0.75 % IJ SOLN
INTRAMUSCULAR | Status: AC
  Filled 2016-04-04: qty 10

## 2016-04-04 SURGICAL SUPPLY — 30 items
CANNULA ANT/CHMB 27GA (MISCELLANEOUS) ×3 IMPLANT
CORD BIP STRL DISP 12FT (MISCELLANEOUS) ×3 IMPLANT
CUP MEDICINE 2OZ PLAST GRAD ST (MISCELLANEOUS) ×3 IMPLANT
DRAPE XRAY CASSETTE 23X24 (DRAPES) ×3 IMPLANT
ERASER HMR WETFIELD 18G (MISCELLANEOUS) ×3 IMPLANT
GLOVE BIO SURGEON STRL SZ8 (GLOVE) ×3 IMPLANT
GLOVE SURG LX 6.5 MICRO (GLOVE) ×2
GLOVE SURG LX 8.0 MICRO (GLOVE) ×2
GLOVE SURG LX STRL 6.5 MICRO (GLOVE) ×1 IMPLANT
GLOVE SURG LX STRL 8.0 MICRO (GLOVE) ×1 IMPLANT
GOWN STRL REUS W/ TWL LRG LVL3 (GOWN DISPOSABLE) ×1 IMPLANT
GOWN STRL REUS W/ TWL XL LVL3 (GOWN DISPOSABLE) ×1 IMPLANT
GOWN STRL REUS W/TWL LRG LVL3 (GOWN DISPOSABLE) ×2
GOWN STRL REUS W/TWL XL LVL3 (GOWN DISPOSABLE) ×2
LENS IOL ACRSF IQ 10.0 (Intraocular Lens) ×1 IMPLANT
LENS IOL ACRYSOF IQ 10.0 (Intraocular Lens) ×3 IMPLANT
PACK CATARACT (MISCELLANEOUS) ×3 IMPLANT
PACK CATARACT DINGLEDEIN LX (MISCELLANEOUS) ×3 IMPLANT
PACK EYE AFTER SURG (MISCELLANEOUS) ×3 IMPLANT
SHLD EYE VISITEC  UNIV (MISCELLANEOUS) ×3 IMPLANT
SOL BSS BAG (MISCELLANEOUS) ×3
SOL PREP PVP 2OZ (MISCELLANEOUS) ×3
SOLUTION BSS BAG (MISCELLANEOUS) ×1 IMPLANT
SOLUTION PREP PVP 2OZ (MISCELLANEOUS) ×1 IMPLANT
SUT SILK 5-0 (SUTURE) ×3 IMPLANT
SYR 3ML LL SCALE MARK (SYRINGE) ×3 IMPLANT
SYR 5ML LL (SYRINGE) ×3 IMPLANT
SYR TB 1ML 27GX1/2 LL (SYRINGE) ×3 IMPLANT
WATER STERILE IRR 250ML POUR (IV SOLUTION) ×3 IMPLANT
WIPE NON LINTING 3.25X3.25 (MISCELLANEOUS) ×3 IMPLANT

## 2016-04-04 NOTE — Discharge Instructions (Addendum)
Eye Surgery Discharge Instructions  Expect mild scratchy sensation or mild soreness. DO NOT RUB YOUR EYE!  The day of surgery:  Minimal physical activity, but bed rest is not required  No reading, computer work, or close hand work  No bending, lifting, or straining.  May watch TV  For 24 hours:  No driving, legal decisions, or alcoholic beverages  Safety precautions  Eat anything you prefer: It is better to start with liquids, then soup then solid foods.  _____ Eye patch should be worn until postoperative exam tomorrow.  ____ Solar shield eyeglasses should be worn for comfort in the sunlight/patch while sleeping  Resume all regular medications including aspirin or Coumadin if these were discontinued prior to surgery. You may shower, bathe, shave, or wash your hair. Tylenol may be taken for mild discomfort.  Call your doctor if you experience significant pain, nausea, or vomiting, fever > 101 or other signs of infection. [phone] or [phone] Specific instructions:  Follow-up Information    Tanga Gloor, MD Follow up.   Specialty:  Ophthalmology Why:  Thursday 04/05/16 @ 8:20 AM Contact information: [address]   [place] [address] [phone]

## 2016-04-04 NOTE — Interval H&P Note (Signed)
History and Physical Interval Note:  04/04/2016 7:29 AM  Travis Mills.  has presented today for surgery, with the diagnosis of CATARACT  The various methods of treatment have been discussed with the patient and family. After consideration of risks, benefits and other options for treatment, the patient has consented to  Procedure(s): CATARACT EXTRACTION PHACO AND INTRAOCULAR LENS PLACEMENT ([place]) (Left) as a surgical intervention .  The patient's history has been reviewed, patient examined, no change in status, stable for surgery.  I have reviewed the patient's chart and labs.  Questions were answered to the patient's satisfaction.     Travis Mills

## 2016-04-04 NOTE — Transfer of Care (Signed)
Immediate Anesthesia Transfer of Care Note  Patient: Travis Mills.  Procedure(s) Performed: Procedure(s) with comments: CATARACT EXTRACTION PHACO AND INTRAOCULAR LENS PLACEMENT ([facility]) (Left) - [place]  01:25 AP% 21.3 CDE 30.15 Fluid pack lot # [id number]  Patient Location: PACU  Anesthesia Type:MAC  Level of Consciousness: awake, alert  and oriented  Airway & Oxygen Therapy: Patient Spontanous Breathing  Post-op Assessment: Report given to RN and Post -op Vital signs reviewed and stable  Post vital signs: Reviewed and stable  Last Vitals:  Vitals:   04/04/16 0605 04/04/16 0827  BP: 124/86 127/84  Pulse: 71 65  Resp: 18 16  Temp: 36.5 C 36.5 C    Last Pain:  Vitals:   04/04/16 0827  TempSrc: Temporal         Complications: No apparent anesthesia complications

## 2016-04-04 NOTE — Anesthesia Post-op Follow-up Note (Cosign Needed)
Anesthesia QCDR form completed.        

## 2016-04-04 NOTE — Anesthesia Postprocedure Evaluation (Signed)
Anesthesia Post Note  Patient: Travis Mills.  Procedure(s) Performed: Procedure(s) (LRB): CATARACT EXTRACTION PHACO AND INTRAOCULAR LENS PLACEMENT ([facility]) (Left)  Patient location during evaluation: PACU Anesthesia Type: General Level of consciousness: awake Pain management: pain level controlled Vital Signs Assessment: post-procedure vital signs reviewed and stable Respiratory status: spontaneous breathing Cardiovascular status: stable Anesthetic complications: no     Last Vitals:  Vitals:   04/04/16 0827 04/04/16 0841  BP: 127/84 128/83  Pulse: 65 67  Resp: 16 16  Temp: 36.5 C     Last Pain:  Vitals:   04/04/16 0827  TempSrc: Temporal                 VAN STAVEREN,Seabron Iannello

## 2016-04-04 NOTE — Op Note (Signed)
Date of Surgery: 04/04/2016 Date of Dictation: 04/04/2016 8:23 AM Pre-operative Diagnosis:  Nuclear Sclerotic Cataract left Eye Post-operative Diagnosis: same Procedure performed: Extra-capsular Cataract Extraction (ECCE) with placement of a posterior chamber intraocular lens (IOL) left Eye IOL:  Implant Name Type Inv. Item Serial No. Manufacturer Lot No. LRB No. Used  LENS IOL ACRYSOF IQ 10.0 - [id number] Intraocular Lens LENS IOL ACRYSOF IQ 10.0 [id number] ALCON   Left 1   Anesthesia: 2% Lidocaine and 4% Marcaine in a 50/50 mixture with 10 unites/ml of Hylenex given as a peribulbar Anesthesiologist: Anesthesiologist: Iver Nestle, MD CRNA: Jennette Bill Complications: none Estimated Blood Loss: less than 1 ml  Description of procedure:  The patient was given anesthesia and sedation via intravenous access. The patient was then prepped and draped in the usual fashion. A 25-gauge needle was bent for initiating the capsulorhexis. A 5-0 silk suture was placed through the conjunctiva superior and inferiorly to serve as bridle sutures. Hemostasis was obtained at the superior limbus using an eraser cautery. A partial thickness groove was made at the anterior surgical limbus with a 64 Beaver blade and this was dissected anteriorly with an Avaya. The anterior chamber was entered at 10 o'clock with a 1.0 mm paracentesis knife and through the lamellar dissection with a 2.6 mm Alcon keratome. Epi-Shugarcaine 0.5 CC [9 cc BSS Plus (Alcon), 3 cc 4% preservative-free lidocaine (Hospira) and 4 cc 1:1000 preservative-free, bisulfite-free epinephrine] was injected into the anterior chamber via the paracentesis tract. Epi-Shugarcaine 0.5 CC [9 cc BSS Plus (Alcon), 3 cc 4% preservative-free lidocaine (Hospira) and 4 cc 1:1000 preservative-free, bisulfite-free epinephrine] was injected into the anterior chamber via the paracentesis tract. DiscoVisc was injected to replace the aqueous and  a continuous tear curvilinear capsulorhexis was performed using a bent 25-gauge needle.  Balance salt on a syringe was used to perform hydro-dissection and phacoemulsification was carried out using a divide and conquer technique. Procedure(s) with comments: CATARACT EXTRACTION PHACO AND INTRAOCULAR LENS PLACEMENT ([facility]) (Left) - [place]  01:25 AP% 21.3 CDE 30.15 Fluid pack lot # [id number]. Irrigation/aspiration was used to remove the residual cortex and the capsular bag was inflated with DiscoVisc. The intraocular lens was inserted into the capsular bag using a pre-loaded UltraSert Delivery System. Irrigation/aspiration was used to remove the residual DiscoVisc. The wound was inflated with balanced salt and checked for leaks. None were found. Miostat was injected via the paracentesis track and 0.1 ml of cefuroxime containing 1 mg of drug  was injected via the paracentesis track. The wound was checked for leaks again and none were found.   The bridal sutures were removed and two drops of Vigamox were placed on the eye. An eye shield was placed to protect the eye and the patient was discharged to the recovery area in good condition.   Cledis Sohn MD

## 2016-04-04 NOTE — Anesthesia Preprocedure Evaluation (Signed)
Anesthesia Evaluation  Patient identified by MRN, date of birth, ID band Patient awake    Reviewed: Allergy & Precautions, NPO status , Patient's Chart, lab work & pertinent test results  Airway Mallampati: II       Dental  (+) Teeth Intact   Pulmonary sleep apnea ,     + decreased breath sounds      Cardiovascular Exercise Tolerance: Good + dysrhythmias  Rhythm:Regular Rate:Normal     Neuro/Psych Anxiety Depression    GI/Hepatic GERD  ,  Endo/Other  negative endocrine ROS  Renal/GU negative Renal ROS     Musculoskeletal   Abdominal (+) + obese,   Peds  Hematology negative hematology ROS (+)   Anesthesia Other Findings   Reproductive/Obstetrics                             Anesthesia Physical Anesthesia Plan  ASA: II  Anesthesia Plan: General   Post-op Pain Management:    Induction: Intravenous  Airway Management Planned: Natural Airway and Nasal Cannula  Additional Equipment:   Intra-op Plan:   Post-operative Plan:   Informed Consent: I have reviewed the patients History and Physical, chart, labs and discussed the procedure including the risks, benefits and alternatives for the proposed anesthesia with the patient or authorized representative who has indicated his/her understanding and acceptance.     Plan Discussed with: CRNA  Anesthesia Plan Comments:         Anesthesia Quick Evaluation

## 2016-05-02 ENCOUNTER — Encounter: Payer: Self-pay | Admitting: *Deleted

## 2016-05-08 NOTE — H&P (Signed)
See scanned note.

## 2016-05-09 ENCOUNTER — Encounter
Admission: RE | Disposition: A | Discharge status: Home / Self Care | Payer: Self-pay | Source: Ambulatory Visit | Attending: Ophthalmology

## 2016-05-09 ENCOUNTER — Encounter: Payer: Self-pay | Admitting: *Deleted

## 2016-05-09 ENCOUNTER — Ambulatory Visit
Admission: RE | Admit: 2016-05-09 | Discharge: 2016-05-09 | Disposition: A | Discharge status: Home / Self Care | Payer: Medicare HMO | Source: Ambulatory Visit | Attending: Ophthalmology | Admitting: Ophthalmology

## 2016-05-09 ENCOUNTER — Ambulatory Visit: Payer: Medicare HMO | Admitting: Anesthesiology

## 2016-05-09 DIAGNOSIS — F419 Anxiety disorder, unspecified: Secondary | ICD-10-CM | POA: Insufficient documentation

## 2016-05-09 DIAGNOSIS — F329 Major depressive disorder, single episode, unspecified: Secondary | ICD-10-CM | POA: Diagnosis not present

## 2016-05-09 DIAGNOSIS — H2511 Age-related nuclear cataract, right eye: Secondary | ICD-10-CM | POA: Diagnosis present

## 2016-05-09 DIAGNOSIS — G4730 Sleep apnea, unspecified: Secondary | ICD-10-CM | POA: Insufficient documentation

## 2016-05-09 DIAGNOSIS — Z79899 Other long term (current) drug therapy: Secondary | ICD-10-CM | POA: Insufficient documentation

## 2016-05-09 DIAGNOSIS — K219 Gastro-esophageal reflux disease without esophagitis: Secondary | ICD-10-CM | POA: Insufficient documentation

## 2016-05-09 HISTORY — DX: Chronic cough: R05.3

## 2016-05-09 HISTORY — PX: CATARACT EXTRACTION W/PHACO: SHX586

## 2016-05-09 HISTORY — DX: Other allergic rhinitis: J30.89

## 2016-05-09 HISTORY — DX: Cough: R05

## 2016-05-09 SURGERY — PHACOEMULSIFICATION, CATARACT, WITH IOL INSERTION
Anesthesia: Monitor Anesthesia Care | Site: Eye | Laterality: Right | Wound class: Clean

## 2016-05-09 MED ORDER — POVIDONE-IODINE 5 % OP SOLN
OPHTHALMIC | Status: AC
  Filled 2016-05-09: qty 30

## 2016-05-09 MED ORDER — ONDANSETRON HCL 4 MG/2ML IJ SOLN
INTRAMUSCULAR | Status: AC
  Filled 2016-05-09: qty 2

## 2016-05-09 MED ORDER — CEFUROXIME OPHTHALMIC INJECTION 1 MG/0.1 ML
INJECTION | OPHTHALMIC | Status: AC
  Filled 2016-05-09: qty 0.1

## 2016-05-09 MED ORDER — BUPIVACAINE HCL (PF) 0.75 % IJ SOLN
INTRAMUSCULAR | Status: AC
  Filled 2016-05-09: qty 10

## 2016-05-09 MED ORDER — SODIUM CHLORIDE 0.9 % IV SOLN
INTRAVENOUS | Status: DC
  Administered 2016-05-09 (×2): via INTRAVENOUS

## 2016-05-09 MED ORDER — CEFUROXIME OPHTHALMIC INJECTION 1 MG/0.1 ML
INJECTION | OPHTHALMIC | Status: DC | PRN
  Administered 2016-05-09: 1 mg via INTRACAMERAL

## 2016-05-09 MED ORDER — LIDOCAINE HCL (PF) 4 % IJ SOLN
INTRAOCULAR | Status: DC | PRN
  Administered 2016-05-09: 4 mL via OPHTHALMIC

## 2016-05-09 MED ORDER — PHENYLEPHRINE HCL 10 % OP SOLN
OPHTHALMIC | Status: AC
  Administered 2016-05-09: 1 [drp] via OPHTHALMIC
  Filled 2016-05-09: qty 5

## 2016-05-09 MED ORDER — CYCLOPENTOLATE HCL 2 % OP SOLN
OPHTHALMIC | Status: AC
  Administered 2016-05-09: 1 [drp] via OPHTHALMIC
  Filled 2016-05-09: qty 2

## 2016-05-09 MED ORDER — HYALURONIDASE HUMAN 150 UNIT/ML IJ SOLN
INTRAMUSCULAR | Status: AC
  Filled 2016-05-09: qty 1

## 2016-05-09 MED ORDER — CYCLOPENTOLATE HCL 2 % OP SOLN
1.0000 [drp] | OPHTHALMIC | Status: AC
  Administered 2016-05-09 (×4): 1 [drp] via OPHTHALMIC

## 2016-05-09 MED ORDER — LIDOCAINE HCL (PF) 4 % IJ SOLN
INTRAMUSCULAR | Status: DC | PRN
  Administered 2016-05-09: 4 mL via OPHTHALMIC

## 2016-05-09 MED ORDER — PHENYLEPHRINE HCL 10 % OP SOLN
1.0000 [drp] | OPHTHALMIC | Status: AC
  Administered 2016-05-09 (×4): 1 [drp] via OPHTHALMIC

## 2016-05-09 MED ORDER — NA CHONDROIT SULF-NA HYALURON 40-17 MG/ML IO SOLN
INTRAOCULAR | Status: AC
  Filled 2016-05-09: qty 1

## 2016-05-09 MED ORDER — MOXIFLOXACIN HCL 0.5 % OP SOLN
OPHTHALMIC | Status: AC
  Administered 2016-05-09: 1 [drp] via OPHTHALMIC
  Filled 2016-05-09: qty 3

## 2016-05-09 MED ORDER — EPINEPHRINE PF 1 MG/ML IJ SOLN
INTRAMUSCULAR | Status: AC
  Filled 2016-05-09: qty 1

## 2016-05-09 MED ORDER — TETRACAINE HCL 0.5 % OP SOLN
OPHTHALMIC | Status: DC | PRN
  Administered 2016-05-09: 2 [drp] via OPHTHALMIC

## 2016-05-09 MED ORDER — NA CHONDROIT SULF-NA HYALURON 40-17 MG/ML IO SOLN
INTRAOCULAR | Status: DC | PRN
  Administered 2016-05-09: 1 mL via INTRAOCULAR

## 2016-05-09 MED ORDER — FENTANYL CITRATE (PF) 100 MCG/2ML IJ SOLN
INTRAMUSCULAR | Status: AC
  Filled 2016-05-09: qty 2

## 2016-05-09 MED ORDER — CARBACHOL 0.01 % IO SOLN
INTRAOCULAR | Status: DC | PRN
  Administered 2016-05-09: 0.5 mL via INTRAOCULAR

## 2016-05-09 MED ORDER — ALFENTANIL 500 MCG/ML IJ INJ
INJECTION | INTRAMUSCULAR | Status: DC | PRN
  Administered 2016-05-09: 250 ug via INTRAVENOUS
  Administered 2016-05-09: 750 ug via INTRAVENOUS

## 2016-05-09 MED ORDER — ONDANSETRON HCL 4 MG/2ML IJ SOLN
INTRAMUSCULAR | Status: DC | PRN
  Administered 2016-05-09: 4 mg via INTRAVENOUS

## 2016-05-09 MED ORDER — MIDAZOLAM HCL 2 MG/2ML IJ SOLN
INTRAMUSCULAR | Status: AC
  Filled 2016-05-09: qty 2

## 2016-05-09 MED ORDER — MOXIFLOXACIN HCL 0.5 % OP SOLN
OPHTHALMIC | Status: DC | PRN
  Administered 2016-05-09: 0.2 mL via OPHTHALMIC

## 2016-05-09 MED ORDER — TETRACAINE HCL 0.5 % OP SOLN
OPHTHALMIC | Status: AC
  Filled 2016-05-09: qty 2

## 2016-05-09 MED ORDER — MOXIFLOXACIN HCL 0.5 % OP SOLN
1.0000 [drp] | OPHTHALMIC | Status: AC
  Administered 2016-05-09 (×3): 1 [drp] via OPHTHALMIC

## 2016-05-09 MED ORDER — MIDAZOLAM HCL 2 MG/2ML IJ SOLN
INTRAMUSCULAR | Status: DC | PRN
  Administered 2016-05-09: 1 mg via INTRAVENOUS

## 2016-05-09 MED ORDER — POVIDONE-IODINE 5 % OP SOLN
OPHTHALMIC | Status: DC | PRN
  Administered 2016-05-09: 1 via OPHTHALMIC

## 2016-05-09 MED ORDER — EPINEPHRINE PF 1 MG/ML IJ SOLN
INTRAMUSCULAR | Status: DC | PRN
  Administered 2016-05-09: 200 mL via OPHTHALMIC

## 2016-05-09 SURGICAL SUPPLY — 30 items
CANNULA ANT/CHMB 27GA (MISCELLANEOUS) ×3 IMPLANT
CORD BIP STRL DISP 12FT (MISCELLANEOUS) ×3 IMPLANT
CUP MEDICINE 2OZ PLAST GRAD ST (MISCELLANEOUS) ×3 IMPLANT
DRAPE XRAY CASSETTE 23X24 (DRAPES) ×3 IMPLANT
ERASER HMR WETFIELD 18G (MISCELLANEOUS) ×3 IMPLANT
GLOVE BIO SURGEON STRL SZ8 (GLOVE) ×3 IMPLANT
GLOVE SURG LX 6.5 MICRO (GLOVE) ×2
GLOVE SURG LX 8.0 MICRO (GLOVE) ×2
GLOVE SURG LX STRL 6.5 MICRO (GLOVE) ×1 IMPLANT
GLOVE SURG LX STRL 8.0 MICRO (GLOVE) ×1 IMPLANT
GOWN STRL REUS W/ TWL LRG LVL3 (GOWN DISPOSABLE) ×1 IMPLANT
GOWN STRL REUS W/ TWL XL LVL3 (GOWN DISPOSABLE) ×1 IMPLANT
GOWN STRL REUS W/TWL LRG LVL3 (GOWN DISPOSABLE) ×2
GOWN STRL REUS W/TWL XL LVL3 (GOWN DISPOSABLE) ×2
LENS IOL ACRSF IQ 10.5 (Intraocular Lens) ×1 IMPLANT
LENS IOL ACRYSOF IQ 10.5 (Intraocular Lens) ×3 IMPLANT
PACK CATARACT (MISCELLANEOUS) ×3 IMPLANT
PACK CATARACT DINGLEDEIN LX (MISCELLANEOUS) ×3 IMPLANT
PACK EYE AFTER SURG (MISCELLANEOUS) ×3 IMPLANT
SHLD EYE VISITEC  UNIV (MISCELLANEOUS) ×3 IMPLANT
SOL BSS BAG (MISCELLANEOUS) ×3
SOL PREP PVP 2OZ (MISCELLANEOUS) ×3
SOLUTION BSS BAG (MISCELLANEOUS) ×1 IMPLANT
SOLUTION PREP PVP 2OZ (MISCELLANEOUS) ×1 IMPLANT
SUT SILK 5-0 (SUTURE) ×3 IMPLANT
SYR 3ML LL SCALE MARK (SYRINGE) ×3 IMPLANT
SYR 5ML LL (SYRINGE) ×3 IMPLANT
SYR TB 1ML 27GX1/2 LL (SYRINGE) ×3 IMPLANT
WATER STERILE IRR 250ML POUR (IV SOLUTION) ×3 IMPLANT
WIPE NON LINTING 3.25X3.25 (MISCELLANEOUS) ×3 IMPLANT

## 2016-05-09 NOTE — Transfer of Care (Signed)
Immediate Anesthesia Transfer of Care Note  Patient: Travis Mills.  Procedure(s) Performed: Procedure(s) with comments: CATARACT EXTRACTION PHACO AND INTRAOCULAR LENS PLACEMENT ([facility]) (Right) - US01:16.5 AP%23.9 CDE31.92 LOT #[id number]  Patient Location: PACU  Anesthesia Type:MAC  Level of Consciousness: awake  Airway & Oxygen Therapy: Patient Spontanous Breathing  Post-op Assessment: Report given to RN and Post -op Vital signs reviewed and stable  Post vital signs: Reviewed and stable  Last Vitals:  Vitals:   05/09/16 0707 05/09/16 0917  BP: 132/86 118/81  Pulse: 71 61  Resp: 18 16  Temp: 36.8 C 36.6 C    Last Pain:  Vitals:   05/09/16 0707  TempSrc: Oral         Complications: No apparent anesthesia complications

## 2016-05-09 NOTE — Anesthesia Postprocedure Evaluation (Signed)
Anesthesia Post Note  Patient: Travis Mills.  Procedure(s) Performed: Procedure(s) (LRB): CATARACT EXTRACTION PHACO AND INTRAOCULAR LENS PLACEMENT ([facility]) (Right)  Anesthesia Type: MAC     Last Vitals:  Vitals:   05/09/16 0707 05/09/16 0917  BP: 132/86 118/81  Pulse: 71 61  Resp: 18 16  Temp: 36.8 C 36.6 C    Last Pain:  Vitals:   05/09/16 0707  TempSrc: Oral                 Buckner Malta

## 2016-05-09 NOTE — Anesthesia Procedure Notes (Signed)
Procedure Name: MAC Date/Time: 05/09/2016 8:56 AM Performed by: Allean Found Pre-anesthesia Checklist: Patient identified, Emergency Drugs available, Suction available, Patient being monitored and Timeout performed Oxygen Delivery Method: Nasal cannula

## 2016-05-09 NOTE — Discharge Instructions (Signed)
Eye Surgery Discharge Instructions  Expect mild scratchy sensation or mild soreness. DO NOT RUB YOUR EYE!  The day of surgery:  Minimal physical activity, but bed rest is not required  No reading, computer work, or close hand work  No bending, lifting, or straining.  May watch TV  For 24 hours:  No driving, legal decisions, or alcoholic beverages  Safety precautions  Eat anything you prefer: It is better to start with liquids, then soup then solid foods.  _____ Eye patch should be worn until postoperative exam tomorrow.  ____ Solar shield eyeglasses should be worn for comfort in the sunlight/patch while sleeping  Resume all regular medications including aspirin or Coumadin if these were discontinued prior to surgery. You may shower, bathe, shave, or wash your hair. Tylenol may be taken for mild discomfort.  Call your doctor if you experience significant pain, nausea, or vomiting, fever > 101 or other signs of infection. [phone] or [phone] Specific instructions:  Follow-up Information    Gavrielle Streck, MD Follow up.   Specialty:  Ophthalmology Why:  March 29 at 8:20am Contact information: [address]   [place] [address] [phone]

## 2016-05-09 NOTE — Op Note (Signed)
Date of Surgery: 05/09/2016 Date of Dictation: 05/09/2016 9:15 AM Pre-operative Diagnosis:  Nuclear Sclerotic Cataract right Eye Post-operative Diagnosis: same Procedure performed: Extra-capsular Cataract Extraction (ECCE) with placement of a posterior chamber intraocular lens (IOL) right Eye IOL:  Implant Name Type Inv. Item Serial No. Manufacturer Lot No. LRB No. Used  LENS IOL ACRYSOF IQ 10.5 - [id number] Intraocular Lens LENS IOL ACRYSOF IQ 10.5 [id number] ALCON   Right 1   Anesthesia: 2% Lidocaine and 4% Marcaine in a 50/50 mixture with 10 unites/ml of Hylenex given as a peribulbar Anesthesiologist: Anesthesiologist: Gunnar Fusi, MD CRNA: Allean Found, CRNA; Nelda Marseille, CRNA Complications: none Estimated Blood Loss: less than 1 ml  Description of procedure:  The patient was given anesthesia and sedation via intravenous access. The patient was then prepped and draped in the usual fashion. A 25-gauge needle was bent for initiating the capsulorhexis. A 5-0 silk suture was placed through the conjunctiva superior and inferiorly to serve as bridle sutures. Hemostasis was obtained at the superior limbus using an eraser cautery. A partial thickness groove was made at the anterior surgical limbus with a 64 Beaver blade and this was dissected anteriorly with an Avaya. The anterior chamber was entered at 10 o'clock with a 1.0 mm paracentesis knife and through the lamellar dissection with a 2.6 mm Alcon keratome. Epi-Shugarcaine 0.5 CC [9 cc BSS Plus (Alcon), 3 cc 4% preservative-free lidocaine (Hospira) and 4 cc 1:1000 preservative-free, bisulfite-free epinephrine] was injected into the anterior chamber via the paracentesis tract. Epi-Shugarcaine 0.5 CC [9 cc BSS Plus (Alcon), 3 cc 4% preservative-free lidocaine (Hospira) and 4 cc 1:1000 preservative-free, bisulfite-free epinephrine] was injected into the anterior chamber via the paracentesis tract. DiscoVisc was injected to  replace the aqueous and a continuous tear curvilinear capsulorhexis was performed using a bent 25-gauge needle.  Balance salt on a syringe was used to perform hydro-dissection and phacoemulsification was carried out using a divide and conquer technique. Procedure(s) with comments: CATARACT EXTRACTION PHACO AND INTRAOCULAR LENS PLACEMENT ([facility]) (Right) - US01:16.5 AP%23.9 CDE31.92 LOT #[id number]. Irrigation/aspiration was used to remove the residual cortex and the capsular bag was inflated with DiscoVisc. The intraocular lens was inserted into the capsular bag using a pre-loaded UltraSert Delivery System. Irrigation/aspiration was used to remove the residual DiscoVisc. The wound was inflated with balanced salt and checked for leaks. None were found. Miostat was injected via the paracentesis track and 0.1 ml of cefuroxime containing 1 mg of drug  was injected via the paracentesis track. The wound was checked for leaks again and none were found.   The bridal sutures were removed and two drops of Vigamox were placed on the eye. An eye shield was placed to protect the eye and the patient was discharged to the recovery area in good condition.   Imanuel Pruiett MD

## 2016-05-09 NOTE — Anesthesia Post-op Follow-up Note (Deleted)
Anesthesia QCDR form completed.        

## 2016-05-09 NOTE — Interval H&P Note (Signed)
History and Physical Interval Note:  05/09/2016 7:33 AM  Travis Mills.  has presented today for surgery, with the diagnosis of cataract  The various methods of treatment have been discussed with the patient and family. After consideration of risks, benefits and other options for treatment, the patient has consented to  Procedure(s): CATARACT EXTRACTION PHACO AND INTRAOCULAR LENS PLACEMENT ([place]) (Right) as a surgical intervention .  The patient's history has been reviewed, patient examined, no change in status, stable for surgery.  I have reviewed the patient's chart and labs.  Questions were answered to the patient's satisfaction.     [name]

## 2016-05-09 NOTE — Anesthesia Preprocedure Evaluation (Signed)
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, no problems since)      Neuro/Psych Anxiety Depression negative neurological ROS     GI/Hepatic Neg liver ROS, GERD  Medicated and Controlled,  Endo/Other  negative endocrine ROS  Renal/GU negative Renal ROS     Musculoskeletal   Abdominal   Peds  Hematology negative hematology ROS (+)   Anesthesia Other Findings   Reproductive/Obstetrics                            Anesthesia Physical Anesthesia Plan  ASA: III  Anesthesia Plan: MAC   Post-op Pain Management:    Induction: Intravenous  Airway Management Planned: Nasal Cannula  Additional Equipment:   Intra-op Plan:   Post-operative Plan:   Informed Consent: I have reviewed the patients History and Physical, chart, labs and discussed the procedure including the risks, benefits and alternatives for the proposed anesthesia with the patient or authorized representative who has indicated his/her understanding and acceptance.     Plan Discussed with:   Anesthesia Plan Comments:         Anesthesia Quick Evaluation

## 2016-05-09 NOTE — Anesthesia Post-op Follow-up Note (Cosign Needed)
Anesthesia QCDR form completed.        

## 2016-07-06 ENCOUNTER — Encounter
Admission: RE | Admit: 2016-07-06 | Discharge: 2016-07-06 | Disposition: A | Discharge status: Home / Self Care | Payer: Medicare HMO | Source: Ambulatory Visit | Attending: Orthopedic Surgery | Admitting: Orthopedic Surgery

## 2016-07-06 DIAGNOSIS — Z01810 Encounter for preprocedural cardiovascular examination: Secondary | ICD-10-CM | POA: Insufficient documentation

## 2016-07-06 DIAGNOSIS — Z01812 Encounter for preprocedural laboratory examination: Secondary | ICD-10-CM | POA: Insufficient documentation

## 2016-07-06 DIAGNOSIS — E785 Hyperlipidemia, unspecified: Secondary | ICD-10-CM | POA: Diagnosis not present

## 2016-07-06 HISTORY — DX: Personal history of urinary calculi: Z87.442

## 2016-07-06 HISTORY — DX: Biliary acute pancreatitis without necrosis or infection: K85.10

## 2016-07-06 HISTORY — DX: Unspecified osteoarthritis, unspecified site: M19.90

## 2016-07-06 LAB — SEDIMENTATION RATE: Sed Rate: 7 mm/hr (ref 0–20)

## 2016-07-06 LAB — SURGICAL PCR SCREEN
MRSA, PCR: NEGATIVE
STAPHYLOCOCCUS AUREUS: NEGATIVE

## 2016-07-06 LAB — C-REACTIVE PROTEIN: CRP: <0.8 mg/dL (ref <1.0)

## 2016-07-06 NOTE — Patient Instructions (Addendum)
  Your procedure is scheduled on: July 16, 2016 (Monday) Report to Same Day Surgery 2nd floor medical mall [facility] Entrance-take elevator on left to 2nd floor.  Check in with surgery information desk.) To find out your arrival time please call [phone] between 1PM - 3PM on July 13, 2016  (Friday)   Remember: Instructions that are not followed completely may result in serious medical risk, up to and including death, or upon the discretion of your surgeon and anesthesiologist your surgery may need to be rescheduled.    _x___ 1. Do not eat food or drink liquids after midnight. No gum chewing or hard candies                              __x__ 2. No Alcohol for 24 hours before or after surgery.   __x__3. No Smoking for 24 prior to surgery.   ____  4. Bring all medications with you on the day of surgery if instructed.    __x__ 5. Notify your doctor if there is any change in your medical condition     (cold, fever, infections).     Do not wear jewelry, make-up, hairpins, clips or nail polish.  Do not wear lotions, powders, or perfumes.   Do not shave 48 hours prior to surgery. Men may shave face and neck.  Do not bring valuables to the hospital.    [facility] is not responsible for any belongings or valuables.               Contacts, dentures or bridgework may not be worn into surgery.  Leave your suitcase in the car. After surgery it may be brought to your room.  For patients admitted to the hospital, discharge time is determined by your  treatment team                       Patients discharged the day of surgery will not be allowed to drive home.  You will need someone to drive you home and stay with you the night of your procedure.    Please read over the following fact sheets that you were given:   [facility] Preparing for Surgery and or MRSA Information   ___ Take anti-hypertensive (unless it includes a diuretic), cardiac, seizure, asthma,     anti-reflux and psychiatric  medicines. These include:  1.   2.  3.  4.  5.  6.  ____Fleets enema or Magnesium Citrate as directed.   _x___ Use CHG Soap or sage wipes as directed on instruction sheet   ____ Use inhalers on the day of surgery and bring to hospital day of surgery  ____ Stop Metformin and Janumet 2 days prior to surgery.    ____ Take 1/2 of usual insulin dose the night before surgery and none on the morning surgery.      _x___ Follow recommendations from Cardiologist, Pulmonologist or PCP regarding          stopping Aspirin, Coumadin, Pllavix ,Eliquis, Effient, or Pradaxa, and Pletal. (PATIENT HAS STOPPED ASPIRIN)  X____Stop Anti-inflammatories such as Advil, Aleve, Ibuprofen, Motrin, Naproxen, Naprosyn, Goodies powders or aspirin products. OK to take Tylenol (STOP IBUPROFEN ONE WEEK PRIOR TO SURGERY )   _x___ Stop supplements until after surgery.  But may continue Vitamin D, Vitamin B, and multivitamin.   _x___ Bring C-Pap to the hospital.

## 2016-07-08 LAB — LATEX, IGE: Latex: <0.1 kU/L

## 2016-07-15 MED ORDER — CEFAZOLIN SODIUM-DEXTROSE 2-4 GM/100ML-% IV SOLN
2.0000 g | INTRAVENOUS | Status: AC
  Administered 2016-07-16: 2 g via INTRAVENOUS

## 2016-07-16 ENCOUNTER — Encounter
Admission: RE | Disposition: A | Discharge status: Home / Self Care | Payer: Self-pay | Source: Ambulatory Visit | Attending: Orthopedic Surgery

## 2016-07-16 ENCOUNTER — Ambulatory Visit
Admission: RE | Admit: 2016-07-16 | Discharge: 2016-07-16 | Disposition: A | Discharge status: Home / Self Care | Payer: Medicare HMO | Source: Ambulatory Visit | Attending: Orthopedic Surgery | Admitting: Orthopedic Surgery

## 2016-07-16 ENCOUNTER — Ambulatory Visit: Payer: Medicare HMO | Admitting: Certified Registered Nurse Anesthetist

## 2016-07-16 ENCOUNTER — Encounter: Payer: Self-pay | Admitting: Orthopedic Surgery

## 2016-07-16 DIAGNOSIS — Z96652 Presence of left artificial knee joint: Secondary | ICD-10-CM | POA: Diagnosis not present

## 2016-07-16 DIAGNOSIS — F418 Other specified anxiety disorders: Secondary | ICD-10-CM | POA: Diagnosis not present

## 2016-07-16 DIAGNOSIS — K589 Irritable bowel syndrome without diarrhea: Secondary | ICD-10-CM | POA: Diagnosis not present

## 2016-07-16 DIAGNOSIS — K219 Gastro-esophageal reflux disease without esophagitis: Secondary | ICD-10-CM | POA: Insufficient documentation

## 2016-07-16 DIAGNOSIS — E785 Hyperlipidemia, unspecified: Secondary | ICD-10-CM | POA: Diagnosis not present

## 2016-07-16 DIAGNOSIS — M228X2 Other disorders of patella, left knee: Secondary | ICD-10-CM | POA: Insufficient documentation

## 2016-07-16 DIAGNOSIS — M67262 Synovial hypertrophy, not elsewhere classified, left lower leg: Secondary | ICD-10-CM | POA: Diagnosis not present

## 2016-07-16 DIAGNOSIS — Z85828 Personal history of other malignant neoplasm of skin: Secondary | ICD-10-CM | POA: Diagnosis not present

## 2016-07-16 DIAGNOSIS — G4730 Sleep apnea, unspecified: Secondary | ICD-10-CM | POA: Diagnosis not present

## 2016-07-16 HISTORY — PX: OSTECTOMY: SHX6439

## 2016-07-16 HISTORY — PX: KNEE ARTHROSCOPY: SHX127

## 2016-07-16 SURGERY — ARTHROSCOPY, KNEE
Anesthesia: General | Site: Knee | Laterality: Left | Wound class: Clean

## 2016-07-16 MED ORDER — FENTANYL CITRATE (PF) 100 MCG/2ML IJ SOLN
INTRAMUSCULAR | Status: AC
  Filled 2016-07-16: qty 2

## 2016-07-16 MED ORDER — SODIUM CHLORIDE 0.9 % IJ SOLN
INTRAMUSCULAR | Status: AC
  Filled 2016-07-16: qty 10

## 2016-07-16 MED ORDER — CHLORHEXIDINE GLUCONATE 4 % EX LIQD: 60.0000 mL | Freq: Once | CUTANEOUS | Status: DC

## 2016-07-16 MED ORDER — DEXAMETHASONE SODIUM PHOSPHATE 10 MG/ML IJ SOLN
INTRAMUSCULAR | Status: AC
Start: 2016-07-16 — End: 2016-07-16
  Filled 2016-07-16: qty 1

## 2016-07-16 MED ORDER — MIDAZOLAM HCL 2 MG/2ML IJ SOLN
INTRAMUSCULAR | Status: AC
Start: 2016-07-16 — End: 2016-07-16
  Filled 2016-07-16: qty 2

## 2016-07-16 MED ORDER — PROPOFOL 10 MG/ML IV BOLUS
INTRAVENOUS | Status: DC | PRN
  Administered 2016-07-16: 200 mg via INTRAVENOUS

## 2016-07-16 MED ORDER — ONDANSETRON HCL 4 MG/2ML IJ SOLN
INTRAMUSCULAR | Status: DC | PRN
Start: 2016-07-16 — End: 2016-07-16
  Administered 2016-07-16: 4 mg via INTRAVENOUS

## 2016-07-16 MED ORDER — MORPHINE SULFATE (PF) 4 MG/ML IV SOLN
INTRAVENOUS | Status: AC
  Filled 2016-07-16: qty 1

## 2016-07-16 MED ORDER — PROPOFOL 10 MG/ML IV BOLUS
INTRAVENOUS | Status: AC
  Filled 2016-07-16: qty 20

## 2016-07-16 MED ORDER — DEXAMETHASONE SODIUM PHOSPHATE 10 MG/ML IJ SOLN
INTRAMUSCULAR | Status: DC | PRN
  Administered 2016-07-16: 4 mg via INTRAVENOUS

## 2016-07-16 MED ORDER — FAMOTIDINE 20 MG PO TABS
20.0000 mg | ORAL_TABLET | Freq: Once | ORAL | Status: AC
  Administered 2016-07-16: 20 mg via ORAL

## 2016-07-16 MED ORDER — OXYCODONE HCL 5 MG/5ML PO SOLN: 5.0000 mg | Freq: Once | ORAL | Status: AC | PRN

## 2016-07-16 MED ORDER — FAMOTIDINE 20 MG PO TABS
ORAL_TABLET | ORAL | Status: AC
  Filled 2016-07-16: qty 1

## 2016-07-16 MED ORDER — FENTANYL CITRATE (PF) 100 MCG/2ML IJ SOLN: 25.0000 ug | INTRAMUSCULAR | Status: DC | PRN

## 2016-07-16 MED ORDER — BUPIVACAINE-EPINEPHRINE (PF) 0.25% -1:200000 IJ SOLN
INTRAMUSCULAR | Status: AC
  Filled 2016-07-16: qty 30

## 2016-07-16 MED ORDER — FENTANYL CITRATE (PF) 100 MCG/2ML IJ SOLN
INTRAMUSCULAR | Status: DC | PRN
  Administered 2016-07-16 (×3): 50 ug via INTRAVENOUS

## 2016-07-16 MED ORDER — ONDANSETRON HCL 4 MG/2ML IJ SOLN
INTRAMUSCULAR | Status: AC
Start: 2016-07-16 — End: 2016-07-16
  Filled 2016-07-16: qty 2

## 2016-07-16 MED ORDER — PROMETHAZINE HCL 25 MG/ML IJ SOLN: 6.2500 mg | INTRAMUSCULAR | Status: DC | PRN

## 2016-07-16 MED ORDER — EPHEDRINE SULFATE 50 MG/ML IJ SOLN
INTRAMUSCULAR | Status: AC
  Filled 2016-07-16: qty 1

## 2016-07-16 MED ORDER — BUPIVACAINE-EPINEPHRINE (PF) 0.25% -1:200000 IJ SOLN
INTRAMUSCULAR | Status: DC | PRN
  Administered 2016-07-16: 5 mL via PERINEURAL

## 2016-07-16 MED ORDER — HYDROCODONE-ACETAMINOPHEN 5-325 MG PO TABS: 1.0000 | ORAL_TABLET | ORAL | 0 refills | Status: DC | PRN

## 2016-07-16 MED ORDER — OXYCODONE HCL 5 MG PO TABS
ORAL_TABLET | ORAL | Status: AC
  Filled 2016-07-16: qty 1

## 2016-07-16 MED ORDER — EPHEDRINE SULFATE 50 MG/ML IJ SOLN
INTRAMUSCULAR | Status: DC | PRN
  Administered 2016-07-16 (×3): 5 mg via INTRAVENOUS

## 2016-07-16 MED ORDER — MORPHINE SULFATE 4 MG/ML IJ SOLN
INTRAMUSCULAR | Status: DC | PRN
  Administered 2016-07-16: 4 mg via INTRAMUSCULAR

## 2016-07-16 MED ORDER — LACTATED RINGERS IV SOLN
INTRAVENOUS | Status: DC
  Administered 2016-07-16 (×2): via INTRAVENOUS

## 2016-07-16 MED ORDER — OXYCODONE HCL 5 MG PO TABS
5.0000 mg | ORAL_TABLET | Freq: Once | ORAL | Status: AC | PRN
  Administered 2016-07-16: 5 mg via ORAL

## 2016-07-16 MED ORDER — MIDAZOLAM HCL 2 MG/2ML IJ SOLN
INTRAMUSCULAR | Status: DC | PRN
  Administered 2016-07-16: 2 mg via INTRAVENOUS

## 2016-07-16 MED ORDER — MEPERIDINE HCL 50 MG/ML IJ SOLN: 6.2500 mg | INTRAMUSCULAR | Status: DC | PRN

## 2016-07-16 SURGICAL SUPPLY — 24 items
BLADE SHAVER 4.5 DBL SERAT CV (CUTTER) IMPLANT
BNDG ESMARK 6X12 TAN STRL LF (GAUZE/BANDAGES/DRESSINGS) ×4 IMPLANT
BUR ACROMIONIZER 4.0 (BURR) ×4 IMPLANT
CUFF TOURN 24 STER (MISCELLANEOUS) IMPLANT
CUFF TOURN 30 STER DUAL PORT (MISCELLANEOUS) ×4 IMPLANT
DRSG DERMACEA 8X12 NADH (GAUZE/BANDAGES/DRESSINGS) ×4 IMPLANT
DURAPREP 26ML APPLICATOR (WOUND CARE) ×4 IMPLANT
GAUZE SPONGE 4X4 12PLY STRL (GAUZE/BANDAGES/DRESSINGS) ×4 IMPLANT
GLOVE BIOGEL M STRL SZ7.5 (GLOVE) ×8 IMPLANT
GLOVE INDICATOR 8.0 STRL GRN (GLOVE) ×4 IMPLANT
GOWN STRL REUS W/ TWL LRG LVL3 (GOWN DISPOSABLE) ×4 IMPLANT
GOWN STRL REUS W/TWL LRG LVL3 (GOWN DISPOSABLE) ×4
IV LACTATED RINGER IRRG 3000ML (IV SOLUTION) ×40
IV LR IRRIG 3000ML ARTHROMATIC (IV SOLUTION) ×40 IMPLANT
KIT RM TURNOVER STRD PROC AR (KITS) ×4 IMPLANT
MANIFOLD NEPTUNE II (INSTRUMENTS) ×4 IMPLANT
PACK ARTHROSCOPY KNEE (MISCELLANEOUS) ×4 IMPLANT
SET TUBE SUCT SHAVER OUTFL 24K (TUBING) ×4 IMPLANT
SET TUBE TIP INTRA-ARTICULAR (MISCELLANEOUS) ×4 IMPLANT
SUT ETHILON 3-0 FS-10 30 BLK (SUTURE) ×4
SUTURE EHLN 3-0 FS-10 30 BLK (SUTURE) ×2 IMPLANT
TUBING ARTHRO INFLOW-ONLY STRL (TUBING) ×4 IMPLANT
WAND COBLATION FLOW 50 (SURGICAL WAND) ×4 IMPLANT
WRAP KNEE W/COLD PACKS 25.5X14 (SOFTGOODS) ×4 IMPLANT

## 2016-07-16 NOTE — Discharge Instructions (Signed)
°  Instructions after Knee Arthroscopy  ° °- Lark P. Hooten, Jr., M.D.    ° Dept. of Orthopaedics & Sports Medicine ° [facility] ° [address] ° [place], [place]  [address] ° ° Phone: [phone]   Fax: [phone] ° ° °DIET: °• Drink plenty of non-alcoholic fluids & begin a light diet. °• Resume your normal diet the day after surgery. ° °ACTIVITY:  °• You may use crutches or a walker with weight-bearing as tolerated, unless instructed otherwise. °• You may wean yourself off of the walker or crutches as tolerated.  °• Begin doing gentle exercises. Exercising will reduce the pain and swelling, increase motion, and prevent muscle weakness.   °• Avoid strenuous activities or athletics for a minimum of 4-6 weeks after arthroscopic surgery. °• Do not drive or operate any equipment until instructed. ° °WOUND CARE:  °• Place one to two pillows under the knee the first day or two when sitting or lying.  °• Continue to use the ice packs periodically to reduce pain and swelling. °• The small incisions in your knee are closed with nylon stitches. The stitches will be removed in the office. °• The bulky dressing may be removed on the second day after surgery. DO NOT TOUCH THE STITCHES. Put a Band-Aid over each stitch. Do NOT use any ointments or creams on the incisions.  °• You may bathe or shower after the stitches are removed at the first office visit following surgery. ° °MEDICATIONS: °• You may resume your regular medications. °• Please take the pain medication as prescribed. °• Do not take pain medication on an empty stomach. °• Do not drive or drink alcoholic beverages when taking pain medications. ° °CALL THE OFFICE FOR: °• Temperature above 101 degrees °• Excessive bleeding or drainage on the dressing. °• Excessive swelling, coldness, or paleness of the toes. °• Persistent nausea and vomiting. ° °FOLLOW-UP:  °• You should have an appointment to return to the office in 7-10 days after surgery.   ° ° °AMBULATORY SURGERY  °DISCHARGE INSTRUCTIONS ° ° °1) The drugs that you were given will stay in your system until tomorrow so for the next 24 hours you should not: ° °A) Drive an automobile °B) Make any legal decisions °C) Drink any alcoholic beverage ° ° °2) You may resume regular meals tomorrow.  Today it is better to start with liquids and gradually work up to solid foods. ° °You may eat anything you prefer, but it is better to start with liquids, then soup and crackers, and gradually work up to solid foods. ° ° °3) Please notify your doctor immediately if you have any unusual bleeding, trouble breathing, redness and pain at the surgery site, drainage, fever, or pain not relieved by medication. ° ° ° °4) Additional Instructions: ° ° ° ° ° ° ° °Please contact your physician with any problems or Same Day Surgery at [phone], Monday through Friday 6 am to 4 pm, or [place] [facility] [place] Main number at [phone]. ° °

## 2016-07-16 NOTE — Anesthesia Post-op Follow-up Note (Cosign Needed)
Anesthesia QCDR form completed.        

## 2016-07-16 NOTE — Transfer of Care (Signed)
Immediate Anesthesia Transfer of Care Note  Patient: Travis Mills.  Procedure(s) Performed: Procedure(s): ARTHROSCOPY KNEE (Left) OSTECTOMY  Patient Location: PACU  Anesthesia Type:General  Level of Consciousness: sedated  Airway & Oxygen Therapy: Patient Spontanous Breathing and Patient connected to face mask oxygen  Post-op Assessment: Report given to RN and Post -op Vital signs reviewed and stable  Post vital signs: Reviewed and stable  Last Vitals:  Vitals:   07/16/16 1759 07/16/16 1800  BP: 127/78 127/78  Pulse: 69 67  Resp: 13 12  Temp: 36.4 C     Last Pain:  Vitals:   07/16/16 1400  TempSrc: Tympanic  PainSc: 5       Patients Stated Pain Goal: 2 (84/83/50 7573)  Complications: No apparent anesthesia complications

## 2016-07-16 NOTE — Anesthesia Preprocedure Evaluation (Signed)
Anesthesia Evaluation  Patient identified by MRN, date of birth, ID band Patient awake    Reviewed: Allergy & Precautions, NPO status , Patient's Chart, lab work & pertinent test results  History of Anesthesia Complications (+) PONV and history of anesthetic complications  Airway Mallampati: II  TM Distance: >3 FB Neck ROM: Full    Dental no notable dental hx.    Pulmonary sleep apnea and Continuous Positive Airway Pressure Ventilation , neg COPD,    breath sounds clear to auscultation- rhonchi (-) wheezing      Cardiovascular Exercise Tolerance: Good (-) hypertension(-) CAD and (-) Past MI + dysrhythmias (hx of complete heart block )  Rhythm:Regular Rate:Normal - Systolic murmurs and - Diastolic murmurs    Neuro/Psych PSYCHIATRIC DISORDERS Anxiety Depression negative neurological ROS     GI/Hepatic Neg liver ROS, GERD  ,  Endo/Other  negative endocrine ROSneg diabetes  Renal/GU negative Renal ROS     Musculoskeletal  (+) Arthritis ,   Abdominal (+) + obese,   Peds  Hematology negative hematology ROS (+)   Anesthesia Other Findings Past Medical History: 2009: Acute gallstone pancreatitis No date: Anxiety No date: Arthritis No date: Cancer [facility])     Comment: Basal Cell Skin Cancer No date: Chronic cough No date: Complication of anesthesia     Comment: heart block during surgery around 2014 poss               r/t lithium No date: Cough     Comment: CHRONIC No date: Depression No date: Environmental and seasonal allergies No date: GERD (gastroesophageal reflux disease)     Comment: occasionally No date: Heart block     Comment: complete heart block in 2014, thought to be               due to lithium No date: History of kidney stones No date: HOH (hard of hearing)     Comment: AIDS No date: Hyperlipidemia No date: Irritable bowel syndrome (IBS)     Comment: welchol has improved bowel problems No date:  PONV (postoperative nausea and vomiting) No date: Sleep apnea     Comment: CPAP   Reproductive/Obstetrics                             Anesthesia Physical Anesthesia Plan  ASA: III  Anesthesia Plan: General   Post-op Pain Management:    Induction: Intravenous  Airway Management Planned: LMA  Additional Equipment:   Intra-op Plan:   Post-operative Plan:   Informed Consent: I have reviewed the patients History and Physical, chart, labs and discussed the procedure including the risks, benefits and alternatives for the proposed anesthesia with the patient or authorized representative who has indicated his/her understanding and acceptance.   Dental advisory given  Plan Discussed with: CRNA and Anesthesiologist  Anesthesia Plan Comments:         Anesthesia Quick Evaluation

## 2016-07-16 NOTE — Anesthesia Procedure Notes (Signed)
Procedure Name: LMA Insertion Performed by: Jakiah Bienaime Pre-anesthesia Checklist: Patient identified, Patient being monitored, Timeout performed, Emergency Drugs available and Suction available Patient Re-evaluated:Patient Re-evaluated prior to inductionOxygen Delivery Method: Circle system utilized Preoxygenation: Pre-oxygenation with 100% oxygen Intubation Type: IV induction LMA: LMA inserted LMA Size: 4.5 Tube type: Oral Number of attempts: 1 Placement Confirmation: positive ETCO2 and breath sounds checked- equal and bilateral Tube secured with: Tape Dental Injury: Teeth and Oropharynx as per pre-operative assessment        

## 2016-07-16 NOTE — H&P (Signed)
The patient has been re-examined, and the chart reviewed, and there have been no interval changes to the documented history and physical.    The risks, benefits, and alternatives have been discussed at length. The patient expressed understanding of the risks benefits and agreed with plans for surgical intervention.  Leeroy P. Hooten, Jr. M.D.    

## 2016-07-16 NOTE — Op Note (Signed)
OPERATIVE NOTE  DATE OF SURGERY:  07/16/2016  PATIENT NAME:  Travis Mills.   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   ASSISTANT: none  ANESTHESIA: general  ESTIMATED BLOOD LOSS: Minimal  FLUIDS REPLACED: 700 mL of crystalloid  TOURNIQUET TIME: Not used   DRAINS: none  IMPLANTS UTILIZED: None  INDICATIONS FOR SURGERY: Brode Sculley. is a 71 y.o. year old male who has been seen for complaints of left infrapatellar knee pain. X-rays demonstrated an osteochondral prominence emanating from the inferior pole of the patella which was new since his total knee arthroplasty 1 year ago. After discussion of the risks and benefits of surgical intervention, the patient expressed understanding of the risks benefits and agree with plans for left knee arthroscopy.   PROCEDURE IN DETAIL: The patient was brought into the operating room and, after adequate general anesthesia was achieved, a tourniquet was applied to the left thigh and the leg was placed in the leg holder. All bony prominences were well padded. The patient's left knee was cleaned and prepped with alcohol and Duraprep and draped in the usual sterile fashion. A "timeout" was performed as per usual protocol. The anticipated portal sites were injected with 0.25% Marcaine with epinephrine. An anterolateral incision was made and a cannula was inserted.The knee was distended with fluid using the pump. The scope was advanced down the medial gutter into the medial compartment. Under visualization with the scope, an anteromedial portal was created and a hooked probe was inserted. Synovial debridement was initiated using the ArthroCare wand so as to better visualize the  joint. The total knee implants were visualized and noted be in excellent condition without evidence of wear or abrasions. Further synovectomy was performed around the patella. The patellar component was visualized and noted to be in excellent condition. Hypertrophied synovium was debrided from the superior pole of the patella. After synovectomy along the infrapatellar region, there was noted be a prominent osteochondral eminence from the inferior pole of patella. Initial attempts at mobilization of the lesion unsuccessful. Partial debridement was performed using pituitary rongeurs. A 4.0 mm bur was then used to debride the osteochondral prominence. The knee was flushed with copious amounts of fluid. Inspection of the patella and again showed to be in excellent condition. Good patellar tracking was noted.   The knee was irrigated with copius amounts of fluid and suctioned dry. The anterolateral portal was re-approximated with #3-0 nylon. A combination of 0.25% Marcaine with epinephrine and 4 mg of Morphine were injected via the scope. The scope was removed and the anteromedial portal was re-approximated with #3-0 nylon. A sterile dressing was applied followed by application of an ice wrap.  The patient tolerated the procedure well and was transported to the PACU in stable condition.  Salvator P. Holley Bouche., M.D.

## 2016-07-17 ENCOUNTER — Encounter: Payer: Self-pay | Admitting: Orthopedic Surgery

## 2016-07-19 NOTE — Anesthesia Postprocedure Evaluation (Signed)
Anesthesia Post Note  Patient: Travis Mills.  Procedure(s) Performed: Procedure(s) (LRB): ARTHROSCOPY KNEE (Left) OSTECTOMY  Patient location during evaluation: PACU Anesthesia Type: General Level of consciousness: awake and alert Pain management: pain level controlled Vital Signs Assessment: post-procedure vital signs reviewed and stable Respiratory status: spontaneous breathing, nonlabored ventilation, respiratory function stable and patient connected to nasal cannula oxygen Cardiovascular status: blood pressure returned to baseline and stable Postop Assessment: no signs of nausea or vomiting Anesthetic complications: no     Last Vitals:  Vitals:   07/16/16 1829 07/16/16 1856  BP: 120/72 113/81  Pulse: 78 68  Resp: 18 16  Temp: 36.6 C 36.6 C    Last Pain:  Vitals:   07/17/16 0900  TempSrc:   PainSc: 2                  Molli Barrows

## 2016-12-10 ENCOUNTER — Encounter
Admission: RE | Admit: 2016-12-10 | Discharge: 2016-12-10 | Disposition: A | Discharge status: Home / Self Care | Payer: Medicare HMO | Source: Ambulatory Visit | Attending: Orthopedic Surgery | Admitting: Orthopedic Surgery

## 2016-12-10 DIAGNOSIS — Z79899 Other long term (current) drug therapy: Secondary | ICD-10-CM | POA: Diagnosis not present

## 2016-12-10 DIAGNOSIS — Z85828 Personal history of other malignant neoplasm of skin: Secondary | ICD-10-CM | POA: Diagnosis not present

## 2016-12-10 DIAGNOSIS — F329 Major depressive disorder, single episode, unspecified: Secondary | ICD-10-CM | POA: Diagnosis not present

## 2016-12-10 DIAGNOSIS — Z7982 Long term (current) use of aspirin: Secondary | ICD-10-CM | POA: Diagnosis not present

## 2016-12-10 DIAGNOSIS — B20 Human immunodeficiency virus [HIV] disease: Secondary | ICD-10-CM | POA: Diagnosis not present

## 2016-12-10 DIAGNOSIS — Z91040 Latex allergy status: Secondary | ICD-10-CM | POA: Diagnosis not present

## 2016-12-10 DIAGNOSIS — Z96652 Presence of left artificial knee joint: Secondary | ICD-10-CM | POA: Diagnosis not present

## 2016-12-10 DIAGNOSIS — K589 Irritable bowel syndrome without diarrhea: Secondary | ICD-10-CM | POA: Diagnosis not present

## 2016-12-10 DIAGNOSIS — M1990 Unspecified osteoarthritis, unspecified site: Secondary | ICD-10-CM | POA: Diagnosis not present

## 2016-12-10 DIAGNOSIS — F419 Anxiety disorder, unspecified: Secondary | ICD-10-CM | POA: Diagnosis not present

## 2016-12-10 DIAGNOSIS — I442 Atrioventricular block, complete: Secondary | ICD-10-CM | POA: Diagnosis not present

## 2016-12-10 DIAGNOSIS — E785 Hyperlipidemia, unspecified: Secondary | ICD-10-CM | POA: Diagnosis not present

## 2016-12-10 DIAGNOSIS — M25762 Osteophyte, left knee: Secondary | ICD-10-CM | POA: Diagnosis not present

## 2016-12-10 DIAGNOSIS — M7652 Patellar tendinitis, left knee: Secondary | ICD-10-CM | POA: Diagnosis not present

## 2016-12-10 DIAGNOSIS — G4730 Sleep apnea, unspecified: Secondary | ICD-10-CM | POA: Diagnosis not present

## 2016-12-10 DIAGNOSIS — I10 Essential (primary) hypertension: Secondary | ICD-10-CM | POA: Diagnosis not present

## 2016-12-10 DIAGNOSIS — N529 Male erectile dysfunction, unspecified: Secondary | ICD-10-CM | POA: Diagnosis not present

## 2016-12-10 DIAGNOSIS — Z9989 Dependence on other enabling machines and devices: Secondary | ICD-10-CM | POA: Diagnosis not present

## 2016-12-10 DIAGNOSIS — Z87442 Personal history of urinary calculi: Secondary | ICD-10-CM | POA: Diagnosis not present

## 2016-12-10 DIAGNOSIS — K219 Gastro-esophageal reflux disease without esophagitis: Secondary | ICD-10-CM | POA: Diagnosis not present

## 2016-12-10 DIAGNOSIS — M2341 Loose body in knee, right knee: Secondary | ICD-10-CM | POA: Diagnosis not present

## 2016-12-10 DIAGNOSIS — M898X6 Other specified disorders of bone, lower leg: Secondary | ICD-10-CM | POA: Diagnosis not present

## 2016-12-10 HISTORY — DX: Pain in unspecified knee: M25.569

## 2016-12-10 LAB — SURGICAL PCR SCREEN
MRSA, PCR: NEGATIVE
Staphylococcus aureus: NEGATIVE

## 2016-12-10 NOTE — Patient Instructions (Addendum)
Your procedure is scheduled on: 12/12/16  Report to Same Day Surgery 2nd floor medical mall [facility] Entrance-take elevator on left to 2nd floor.  Check in with surgery information desk.) To find out your arrival time please call [phone] between 1PM - 3PM on 12/11/16 Tues  Remember: Instructions that are not followed completely may result in serious medical risk, up to and including death, or upon the discretion of your surgeon and anesthesiologist your surgery may need to be rescheduled.    _x___ 1. Do not eat food after midnight the night before your procedure. You may drink clear liquids up to 2 hours before you are scheduled to arrive at the hospital for your procedure.  Do not drink clear liquids within 2 hours of your scheduled arrival to the hospital.  Clear liquids include  --Water or Apple juice without pulp  --Clear carbohydrate beverage such as ClearFast or Gatorade  --Black Coffee or Clear Tea (No milk, no creamers, do not add anything to                  the coffee or Tea Type 1 and type 2 diabetics should only drink water.  No gum chewing or hard candies.     __x__ 2. No Alcohol for 24 hours before or after surgery.   __x__3. No Smoking for 24 prior to surgery.   ____  4. Bring all medications with you on the day of surgery if instructed.    __x__ 5. Notify your doctor if there is any change in your medical condition     (cold, fever, infections).     Do not wear jewelry, make-up, hairpins, clips or nail polish.  Do not wear lotions, powders, or perfumes. You may wear deodorant.  Do not shave 48 hours prior to surgery. Men may shave face and neck.  Do not bring valuables to the hospital.    [facility] is not responsible for any belongings or valuables.               Contacts, dentures or bridgework may not be worn into surgery.  Leave your suitcase in the car. After surgery it may be brought to your room.  For patients admitted to the hospital, discharge  time is determined by your                       treatment team.   Patients discharged the day of surgery will not be allowed to drive home.  You will need someone to drive you home and stay with you the night of your procedure.    Please read over the following fact sheets that you were given:   [facility] Preparing for Surgery and or MRSA Information   _x___ Take anti-hypertensive listed below, cardiac, seizure, asthma,     anti-reflux and psychiatric medicines. These include:  1. esomeprazole (NEXIUM) 20 MG capsule the night before and the morning of surgery  2.  3.  4.  5.  6.  ____Fleets enema or Magnesium Citrate as directed.   _x___ Use CHG Soap or sage wipes as directed on instruction sheet   ____ Use inhalers on the day of surgery and bring to hospital day of surgery  ____ Stop Metformin and Janumet 2 days prior to surgery.    ____ Take 1/2 of usual insulin dose the night before surgery and none on the morning     surgery.   _x___ Follow recommendations from Cardiologist,  Pulmonologist or PCP regarding          stopping Aspirin, Coumadin, Plavix ,Eliquis, Effient, or Pradaxa, and Pletal.  Stopped Aspirin last Wed  X____Stop Anti-inflammatories such as Advil, Aleve, Ibuprofen, Motrin, Naproxen, Naprosyn, Goodies powders or aspirin products. OK to take Tylenol and   Celebrex. Stopped ibuprofen on Thurs of last week   _x___ Stop supplements until after surgery.  But may continue Vitamin D, Vitamin B,       and multivitamin.   _x___ Bring C-Pap to the hospital.

## 2016-12-11 MED ORDER — CEFAZOLIN SODIUM-DEXTROSE 2-4 GM/100ML-% IV SOLN
2.0000 g | INTRAVENOUS | Status: AC
  Administered 2016-12-12: 2 g via INTRAVENOUS

## 2016-12-12 ENCOUNTER — Ambulatory Visit: Payer: Medicare HMO | Admitting: Certified Registered"

## 2016-12-12 ENCOUNTER — Encounter: Payer: Self-pay | Admitting: Orthopedic Surgery

## 2016-12-12 ENCOUNTER — Encounter
Admission: RE | Disposition: A | Discharge status: Home / Self Care | Payer: Self-pay | Source: Ambulatory Visit | Attending: Orthopedic Surgery

## 2016-12-12 ENCOUNTER — Observation Stay
Admission: RE | Admit: 2016-12-12 | Discharge: 2016-12-13 | Disposition: A | Discharge status: Home / Self Care | Payer: Medicare HMO | Source: Ambulatory Visit | Attending: Orthopedic Surgery | Admitting: Orthopedic Surgery

## 2016-12-12 DIAGNOSIS — M25762 Osteophyte, left knee: Principal | ICD-10-CM | POA: Insufficient documentation

## 2016-12-12 DIAGNOSIS — G4730 Sleep apnea, unspecified: Secondary | ICD-10-CM | POA: Insufficient documentation

## 2016-12-12 DIAGNOSIS — M1990 Unspecified osteoarthritis, unspecified site: Secondary | ICD-10-CM | POA: Insufficient documentation

## 2016-12-12 DIAGNOSIS — Z85828 Personal history of other malignant neoplasm of skin: Secondary | ICD-10-CM | POA: Insufficient documentation

## 2016-12-12 DIAGNOSIS — Z79899 Other long term (current) drug therapy: Secondary | ICD-10-CM | POA: Insufficient documentation

## 2016-12-12 DIAGNOSIS — I10 Essential (primary) hypertension: Secondary | ICD-10-CM | POA: Insufficient documentation

## 2016-12-12 DIAGNOSIS — F329 Major depressive disorder, single episode, unspecified: Secondary | ICD-10-CM | POA: Insufficient documentation

## 2016-12-12 DIAGNOSIS — K589 Irritable bowel syndrome without diarrhea: Secondary | ICD-10-CM | POA: Insufficient documentation

## 2016-12-12 DIAGNOSIS — Z9989 Dependence on other enabling machines and devices: Secondary | ICD-10-CM | POA: Insufficient documentation

## 2016-12-12 DIAGNOSIS — B20 Human immunodeficiency virus [HIV] disease: Secondary | ICD-10-CM | POA: Insufficient documentation

## 2016-12-12 DIAGNOSIS — E785 Hyperlipidemia, unspecified: Secondary | ICD-10-CM | POA: Insufficient documentation

## 2016-12-12 DIAGNOSIS — M2341 Loose body in knee, right knee: Secondary | ICD-10-CM | POA: Insufficient documentation

## 2016-12-12 DIAGNOSIS — N529 Male erectile dysfunction, unspecified: Secondary | ICD-10-CM | POA: Insufficient documentation

## 2016-12-12 DIAGNOSIS — F419 Anxiety disorder, unspecified: Secondary | ICD-10-CM | POA: Insufficient documentation

## 2016-12-12 DIAGNOSIS — I442 Atrioventricular block, complete: Secondary | ICD-10-CM | POA: Insufficient documentation

## 2016-12-12 DIAGNOSIS — M7652 Patellar tendinitis, left knee: Secondary | ICD-10-CM | POA: Insufficient documentation

## 2016-12-12 DIAGNOSIS — M2340 Loose body in knee, unspecified knee: Secondary | ICD-10-CM | POA: Diagnosis present

## 2016-12-12 DIAGNOSIS — Z87442 Personal history of urinary calculi: Secondary | ICD-10-CM | POA: Insufficient documentation

## 2016-12-12 DIAGNOSIS — K219 Gastro-esophageal reflux disease without esophagitis: Secondary | ICD-10-CM | POA: Insufficient documentation

## 2016-12-12 DIAGNOSIS — Z7982 Long term (current) use of aspirin: Secondary | ICD-10-CM | POA: Insufficient documentation

## 2016-12-12 DIAGNOSIS — Z91040 Latex allergy status: Secondary | ICD-10-CM | POA: Insufficient documentation

## 2016-12-12 DIAGNOSIS — Z96652 Presence of left artificial knee joint: Secondary | ICD-10-CM | POA: Insufficient documentation

## 2016-12-12 DIAGNOSIS — M898X6 Other specified disorders of bone, lower leg: Secondary | ICD-10-CM | POA: Insufficient documentation

## 2016-12-12 HISTORY — PX: KNEE ARTHROTOMY: SHX5881

## 2016-12-12 SURGERY — ARTHROTOMY, KNEE
Anesthesia: Spinal | Site: Knee | Laterality: Left | Wound class: Clean

## 2016-12-12 MED ORDER — PHENYLEPHRINE HCL 10 MG/ML IJ SOLN
INTRAMUSCULAR | Status: DC | PRN
  Administered 2016-12-12 (×2): 50 ug via INTRAVENOUS
  Administered 2016-12-12 (×2): 100 ug via INTRAVENOUS

## 2016-12-12 MED ORDER — BUPIVACAINE HCL (PF) 0.5 % IJ SOLN
INTRAMUSCULAR | Status: DC | PRN
  Administered 2016-12-12: 3 mL

## 2016-12-12 MED ORDER — NAPHAZOLINE-GLYCERIN 0.012-0.2 % OP SOLN
1.0000 [drp] | Freq: Four times a day (QID) | OPHTHALMIC | Status: DC | PRN
Start: 2016-12-12 — End: 2016-12-13
  Filled 2016-12-12: qty 15

## 2016-12-12 MED ORDER — PROPOFOL 500 MG/50ML IV EMUL
INTRAVENOUS | Status: AC
  Filled 2016-12-12: qty 50

## 2016-12-12 MED ORDER — FLUTICASONE PROPIONATE 50 MCG/ACT NA SUSP
2.0000 | Freq: Every day | NASAL | Status: DC | PRN
Start: 2016-12-12 — End: 2016-12-13
  Filled 2016-12-12: qty 16

## 2016-12-12 MED ORDER — MENTHOL 3 MG MT LOZG
1.0000 | LOZENGE | OROMUCOSAL | Status: DC | PRN
  Filled 2016-12-12: qty 9

## 2016-12-12 MED ORDER — COLESEVELAM HCL 625 MG PO TABS
625.0000 mg | ORAL_TABLET | Freq: Every day | ORAL | Status: DC | PRN
  Administered 2016-12-12: 625 mg via ORAL
  Filled 2016-12-12 (×2): qty 1

## 2016-12-12 MED ORDER — OXYCODONE HCL 5 MG PO TABS
5.0000 mg | ORAL_TABLET | ORAL | Status: DC | PRN
  Administered 2016-12-13: 5 mg via ORAL
  Filled 2016-12-12: qty 1

## 2016-12-12 MED ORDER — LIDOCAINE HCL (CARDIAC) 20 MG/ML IV SOLN
INTRAVENOUS | Status: DC | PRN
  Administered 2016-12-12: 60 mg via INTRAVENOUS

## 2016-12-12 MED ORDER — ASPIRIN 81 MG PO CHEW
81.0000 mg | CHEWABLE_TABLET | Freq: Two times a day (BID) | ORAL | Status: DC
  Administered 2016-12-12 – 2016-12-13 (×2): 81 mg via ORAL
  Filled 2016-12-12 (×2): qty 1

## 2016-12-12 MED ORDER — NEOMYCIN-POLYMYXIN B GU 40-200000 IR SOLN
Status: AC
  Filled 2016-12-12: qty 20

## 2016-12-12 MED ORDER — KETOROLAC TROMETHAMINE 0.5 % OP SOLN
1.0000 [drp] | Freq: Four times a day (QID) | OPHTHALMIC | Status: DC
  Administered 2016-12-12 – 2016-12-13 (×3): 1 [drp] via OPHTHALMIC
  Filled 2016-12-12: qty 3

## 2016-12-12 MED ORDER — SENNOSIDES-DOCUSATE SODIUM 8.6-50 MG PO TABS
1.0000 | ORAL_TABLET | Freq: Two times a day (BID) | ORAL | Status: DC
Start: 2016-12-12 — End: 2016-12-13
  Administered 2016-12-12 – 2016-12-13 (×2): 1 via ORAL
  Filled 2016-12-12 (×2): qty 1

## 2016-12-12 MED ORDER — SODIUM CHLORIDE 0.9 % IJ SOLN
INTRAMUSCULAR | Status: AC
  Filled 2016-12-12: qty 50

## 2016-12-12 MED ORDER — ACETAMINOPHEN 325 MG PO TABS: 650.0000 mg | ORAL_TABLET | ORAL | Status: DC | PRN

## 2016-12-12 MED ORDER — FENTANYL CITRATE (PF) 100 MCG/2ML IJ SOLN
INTRAMUSCULAR | Status: AC
  Filled 2016-12-12: qty 2

## 2016-12-12 MED ORDER — LACTATED RINGERS IV SOLN
INTRAVENOUS | Status: DC
  Administered 2016-12-12 (×2): via INTRAVENOUS

## 2016-12-12 MED ORDER — PROPOFOL 10 MG/ML IV BOLUS
INTRAVENOUS | Status: AC
  Filled 2016-12-12: qty 20

## 2016-12-12 MED ORDER — BISACODYL 10 MG RE SUPP
10.0000 mg | Freq: Every day | RECTAL | Status: DC | PRN
Start: 2016-12-12 — End: 2016-12-13

## 2016-12-12 MED ORDER — PRAVASTATIN SODIUM 20 MG PO TABS
20.0000 mg | ORAL_TABLET | Freq: Every day | ORAL | Status: DC
  Administered 2016-12-12: 20 mg via ORAL
  Filled 2016-12-12: qty 1

## 2016-12-12 MED ORDER — MIDAZOLAM HCL 2 MG/2ML IJ SOLN
INTRAMUSCULAR | Status: AC
  Filled 2016-12-12: qty 2

## 2016-12-12 MED ORDER — CEFAZOLIN SODIUM-DEXTROSE 2-4 GM/100ML-% IV SOLN
INTRAVENOUS | Status: AC
  Filled 2016-12-12: qty 100

## 2016-12-12 MED ORDER — PANTOPRAZOLE SODIUM 40 MG PO TBEC
40.0000 mg | DELAYED_RELEASE_TABLET | Freq: Every day | ORAL | Status: DC
  Administered 2016-12-12 – 2016-12-13 (×2): 40 mg via ORAL
  Filled 2016-12-12 (×2): qty 1

## 2016-12-12 MED ORDER — DEXTROSE 5 % IV SOLN
2.0000 g | Freq: Four times a day (QID) | INTRAVENOUS | Status: DC
  Administered 2016-12-12 – 2016-12-13 (×3): 2 g via INTRAVENOUS
  Filled 2016-12-12 (×4): qty 2000

## 2016-12-12 MED ORDER — SERTRALINE HCL 25 MG PO TABS
25.0000 mg | ORAL_TABLET | Freq: Every day | ORAL | Status: DC
  Administered 2016-12-12: 25 mg via ORAL
  Filled 2016-12-12 (×2): qty 1

## 2016-12-12 MED ORDER — METOCLOPRAMIDE HCL 10 MG PO TABS
10.0000 mg | ORAL_TABLET | Freq: Three times a day (TID) | ORAL | Status: DC
  Administered 2016-12-12 – 2016-12-13 (×2): 10 mg via ORAL
  Filled 2016-12-12 (×2): qty 1

## 2016-12-12 MED ORDER — ONDANSETRON HCL 4 MG/2ML IJ SOLN
4.0000 mg | Freq: Four times a day (QID) | INTRAMUSCULAR | Status: DC | PRN
  Administered 2016-12-13: 4 mg via INTRAVENOUS
  Filled 2016-12-12: qty 2

## 2016-12-12 MED ORDER — NEOMYCIN-POLYMYXIN B GU 40-200000 IR SOLN
Status: AC
  Filled 2016-12-12: qty 4

## 2016-12-12 MED ORDER — ACETAMINOPHEN 10 MG/ML IV SOLN
INTRAVENOUS | Status: DC | PRN
  Administered 2016-12-12: 1000 mg via INTRAVENOUS

## 2016-12-12 MED ORDER — BUPIVACAINE HCL (PF) 0.25 % IJ SOLN
INTRAMUSCULAR | Status: AC
  Filled 2016-12-12: qty 60

## 2016-12-12 MED ORDER — MORPHINE SULFATE (PF) 2 MG/ML IV SOLN: 2.0000 mg | INTRAVENOUS | Status: DC | PRN

## 2016-12-12 MED ORDER — ACETAMINOPHEN 10 MG/ML IV SOLN
1000.0000 mg | Freq: Four times a day (QID) | INTRAVENOUS | Status: DC
  Administered 2016-12-13 (×2): 1000 mg via INTRAVENOUS
  Filled 2016-12-12 (×4): qty 100

## 2016-12-12 MED ORDER — MIDAZOLAM HCL 5 MG/5ML IJ SOLN
INTRAMUSCULAR | Status: DC | PRN
Start: 2016-12-12 — End: 2016-12-12
  Administered 2016-12-12: 1 mg via INTRAVENOUS

## 2016-12-12 MED ORDER — ONDANSETRON HCL 4 MG/2ML IJ SOLN
INTRAMUSCULAR | Status: AC
  Filled 2016-12-12: qty 2

## 2016-12-12 MED ORDER — PROPOFOL 500 MG/50ML IV EMUL
INTRAVENOUS | Status: DC | PRN
  Administered 2016-12-12: 75 ug/kg/min via INTRAVENOUS

## 2016-12-12 MED ORDER — ROCURONIUM BROMIDE 50 MG/5ML IV SOLN
INTRAVENOUS | Status: AC
  Filled 2016-12-12: qty 1

## 2016-12-12 MED ORDER — OXYCODONE HCL 5 MG PO TABS
10.0000 mg | ORAL_TABLET | ORAL | Status: DC | PRN
  Administered 2016-12-12: 10 mg via ORAL
  Filled 2016-12-12: qty 2

## 2016-12-12 MED ORDER — BUPIVACAINE LIPOSOME 1.3 % IJ SUSP
INTRAMUSCULAR | Status: AC
  Filled 2016-12-12: qty 20

## 2016-12-12 MED ORDER — BUPIVACAINE HCL (PF) 0.5 % IJ SOLN
INTRAMUSCULAR | Status: AC
  Filled 2016-12-12: qty 10

## 2016-12-12 MED ORDER — ACETAMINOPHEN 650 MG RE SUPP: 650.0000 mg | RECTAL | Status: DC | PRN

## 2016-12-12 MED ORDER — SUCCINYLCHOLINE CHLORIDE 20 MG/ML IJ SOLN
INTRAMUSCULAR | Status: AC
  Filled 2016-12-12: qty 1

## 2016-12-12 MED ORDER — EPHEDRINE SULFATE 50 MG/ML IJ SOLN
INTRAMUSCULAR | Status: DC | PRN
  Administered 2016-12-12: 10 mg via INTRAVENOUS

## 2016-12-12 MED ORDER — KETOROLAC TROMETHAMINE 0.5 % OP SOLN
1.0000 [drp] | Freq: Three times a day (TID) | OPHTHALMIC | Status: DC | PRN
  Administered 2016-12-12: 1 [drp] via OPHTHALMIC
  Filled 2016-12-12: qty 3

## 2016-12-12 MED ORDER — SODIUM CHLORIDE 0.9 % IV SOLN
INTRAVENOUS | Status: DC
  Administered 2016-12-12: 21:00:00 via INTRAVENOUS

## 2016-12-12 MED ORDER — NEOMYCIN-POLYMYXIN B GU 40-200000 IR SOLN
Status: DC | PRN
Start: 2016-12-12 — End: 2016-12-12
  Administered 2016-12-12: 16 mL

## 2016-12-12 MED ORDER — CHLORHEXIDINE GLUCONATE 4 % EX LIQD: 60.0000 mL | Freq: Once | CUTANEOUS | Status: DC

## 2016-12-12 MED ORDER — NAPHAZOLINE-PHENIRAMINE 0.025-0.3 % OP SOLN
1.0000 [drp] | Freq: Four times a day (QID) | OPHTHALMIC | Status: DC | PRN
  Filled 2016-12-12: qty 5

## 2016-12-12 MED ORDER — TRAMADOL HCL 50 MG PO TABS: 50.0000 mg | ORAL_TABLET | ORAL | Status: DC | PRN

## 2016-12-12 MED ORDER — EPHEDRINE SULFATE 50 MG/ML IJ SOLN
INTRAMUSCULAR | Status: AC
  Filled 2016-12-12: qty 1

## 2016-12-12 MED ORDER — BSS IO SOLN
15.0000 mL | Freq: Once | INTRAOCULAR | Status: DC
  Filled 2016-12-12: qty 15

## 2016-12-12 MED ORDER — LIDOCAINE HCL (PF) 2 % IJ SOLN
INTRAMUSCULAR | Status: AC
  Filled 2016-12-12: qty 10

## 2016-12-12 MED ORDER — FLEET ENEMA 7-19 GM/118ML RE ENEM: 1.0000 | ENEMA | Freq: Once | RECTAL | Status: DC | PRN

## 2016-12-12 MED ORDER — DEXAMETHASONE SODIUM PHOSPHATE 10 MG/ML IJ SOLN
INTRAMUSCULAR | Status: AC
  Filled 2016-12-12: qty 1

## 2016-12-12 MED ORDER — ONDANSETRON HCL 4 MG PO TABS: 4.0000 mg | ORAL_TABLET | Freq: Four times a day (QID) | ORAL | Status: DC | PRN

## 2016-12-12 MED ORDER — PHENYLEPHRINE HCL 10 MG/ML IJ SOLN
INTRAMUSCULAR | Status: AC
  Filled 2016-12-12: qty 1

## 2016-12-12 MED ORDER — MAGNESIUM HYDROXIDE 400 MG/5ML PO SUSP: 30.0000 mL | Freq: Every day | ORAL | Status: DC | PRN

## 2016-12-12 MED ORDER — ALUM & MAG HYDROXIDE-SIMETH 200-200-20 MG/5ML PO SUSP: 30.0000 mL | ORAL | Status: DC | PRN

## 2016-12-12 MED ORDER — POLYMYXIN B-TRIMETHOPRIM 10000-0.1 UNIT/ML-% OP SOLN: 1.0000 [drp] | Freq: Three times a day (TID) | OPHTHALMIC | Status: DC

## 2016-12-12 MED ORDER — FAMOTIDINE 20 MG PO TABS
20.0000 mg | ORAL_TABLET | Freq: Once | ORAL | Status: AC
  Administered 2016-12-12: 20 mg via ORAL

## 2016-12-12 MED ORDER — ONDANSETRON HCL 4 MG/2ML IJ SOLN: 4.0000 mg | Freq: Once | INTRAMUSCULAR | Status: DC | PRN

## 2016-12-12 MED ORDER — ASPIRIN EC 81 MG PO TBEC
81.0000 mg | DELAYED_RELEASE_TABLET | Freq: Every day | ORAL | Status: DC
  Administered 2016-12-12: 81 mg via ORAL
  Filled 2016-12-12: qty 1

## 2016-12-12 MED ORDER — PHENOL 1.4 % MT LIQD
1.0000 | OROMUCOSAL | Status: DC | PRN
  Filled 2016-12-12: qty 177

## 2016-12-12 MED ORDER — FAMOTIDINE 20 MG PO TABS
ORAL_TABLET | ORAL | Status: AC
  Administered 2016-12-12: 20 mg via ORAL
  Filled 2016-12-12: qty 1

## 2016-12-12 MED ORDER — BUPIVACAINE-EPINEPHRINE 0.25% -1:200000 IJ SOLN
INTRAMUSCULAR | Status: DC | PRN
  Administered 2016-12-12: 30 mL

## 2016-12-12 MED ORDER — FENTANYL CITRATE (PF) 100 MCG/2ML IJ SOLN: 25.0000 ug | INTRAMUSCULAR | Status: DC | PRN

## 2016-12-12 MED ORDER — SODIUM CHLORIDE 0.9 % IV SOLN
INTRAVENOUS | Status: DC | PRN
  Administered 2016-12-12: 25 ug/min via INTRAVENOUS

## 2016-12-12 SURGICAL SUPPLY — 78 items
BLADE CLIPPER SURG (BLADE) ×3 IMPLANT
BLADE OSCILLATING/SAGITTAL (BLADE)
BLADE SAW 1 (BLADE) IMPLANT
BLADE SAW 1/2 (BLADE) IMPLANT
BLADE SURG SZ10 CARB STEEL (BLADE) ×3 IMPLANT
BLADE SW THK.38XMED LNG THN (BLADE) IMPLANT
CANISTER SUCT 1200ML W/VALVE (MISCELLANEOUS) ×3 IMPLANT
CANISTER SUCT 3000ML PPV (MISCELLANEOUS) ×3 IMPLANT
CATH TRAY METER 16FR LF (MISCELLANEOUS) ×3 IMPLANT
CNTNR SPEC 2.5X3XGRAD LEK (MISCELLANEOUS)
CONT SPEC 4OZ STER OR WHT (MISCELLANEOUS)
CONTAINER SPEC 2.5X3XGRAD LEK (MISCELLANEOUS) IMPLANT
COOLER POLAR GLACIER W/PUMP (MISCELLANEOUS) IMPLANT
CUFF TOURN 24 STER (MISCELLANEOUS) IMPLANT
CUFF TOURN 30 STER DUAL PORT (MISCELLANEOUS) ×3 IMPLANT
DRAPE FLUOR MINI C-ARM 54X84 (DRAPES) ×3 IMPLANT
DRAPE SHEET LG 3/4 BI-LAMINATE (DRAPES) ×6 IMPLANT
DRAPE TABLE BACK 80X90 (DRAPES) ×3 IMPLANT
DRSG DERMACEA 8X12 NADH (GAUZE/BANDAGES/DRESSINGS) ×3 IMPLANT
DRSG OPSITE POSTOP 4X10 (GAUZE/BANDAGES/DRESSINGS) ×3 IMPLANT
DRSG OPSITE POSTOP 4X14 (GAUZE/BANDAGES/DRESSINGS) ×3 IMPLANT
DURAPREP 26ML APPLICATOR (WOUND CARE) ×3 IMPLANT
ELECT CAUTERY BLADE 6.4 (BLADE) ×3 IMPLANT
ELECT REM PT RETURN 9FT ADLT (ELECTROSURGICAL) ×3
ELECTRODE REM PT RTRN 9FT ADLT (ELECTROSURGICAL) ×1 IMPLANT
EVACUATOR 1/8 PVC DRAIN (DRAIN) IMPLANT
GAUZE SPONGE 4X4 12PLY STRL (GAUZE/BANDAGES/DRESSINGS) ×3 IMPLANT
GLOVE BIO SURGEON STRL SZ 6.5 (GLOVE) ×2 IMPLANT
GLOVE BIO SURGEONS STRL SZ 6.5 (GLOVE) ×1
GLOVE BIOGEL M STRL SZ7.5 (GLOVE) ×3 IMPLANT
GLOVE BIOGEL PI IND STRL 9 (GLOVE) ×1 IMPLANT
GLOVE BIOGEL PI INDICATOR 9 (GLOVE) ×2
GLOVE INDICATOR 6.5 STRL GRN (GLOVE) ×3 IMPLANT
GLOVE INDICATOR 8.0 STRL GRN (GLOVE) ×3 IMPLANT
GLOVE SURG SYN 9.0  PF PI (GLOVE) ×2
GLOVE SURG SYN 9.0 PF PI (GLOVE) ×1 IMPLANT
GOWN STRL REUS W/ TWL LRG LVL3 (GOWN DISPOSABLE) ×2 IMPLANT
GOWN STRL REUS W/TWL 2XL LVL3 (GOWN DISPOSABLE) ×3 IMPLANT
GOWN STRL REUS W/TWL LRG LVL3 (GOWN DISPOSABLE) ×4
HANDPIECE VERSAJET DEBRIDEMENT (MISCELLANEOUS) IMPLANT
HOLDER FOLEY CATH W/STRAP (MISCELLANEOUS) ×3 IMPLANT
HOOD PEEL AWAY FLYTE STAYCOOL (MISCELLANEOUS) ×3 IMPLANT
IRRIGATION STRYKERFLOW (MISCELLANEOUS) ×1 IMPLANT
IRRIGATOR STRYKERFLOW (MISCELLANEOUS) ×3
KIT RM TURNOVER STRD PROC AR (KITS) ×3 IMPLANT
KNIFE SCULPS 14X20 (INSTRUMENTS) ×3 IMPLANT
NDL SAFETY 18GX1.5 (NEEDLE) ×3 IMPLANT
NDL SAFETY ECLIPSE 18X1.5 (NEEDLE) ×1 IMPLANT
NEEDLE HYPO 18GX1.5 SHARP (NEEDLE) ×2
NEEDLE SPNL 18GX3.5 QUINCKE PK (NEEDLE) ×3 IMPLANT
NEEDLE SPNL 20GX3.5 QUINCKE YW (NEEDLE) IMPLANT
NS IRRIG 1000ML POUR BTL (IV SOLUTION) ×3 IMPLANT
PACK TOTAL KNEE (MISCELLANEOUS) ×3 IMPLANT
PAD ABD DERMACEA PRESS 5X9 (GAUZE/BANDAGES/DRESSINGS) ×3 IMPLANT
PAD WRAPON POLAR KNEE (MISCELLANEOUS) ×1 IMPLANT
PULSAVAC PLUS IRRIG FAN TIP (DISPOSABLE)
SOL .9 NS 3000ML IRR  AL (IV SOLUTION) ×2
SOL .9 NS 3000ML IRR UROMATIC (IV SOLUTION) ×1 IMPLANT
SPONGE LAP 18X18 5 PK (GAUZE/BANDAGES/DRESSINGS) ×3 IMPLANT
STAPLER SKIN PROX 35W (STAPLE) ×3 IMPLANT
SUCTION FRAZIER HANDLE 10FR (MISCELLANEOUS) ×2
SUCTION TUBE FRAZIER 10FR DISP (MISCELLANEOUS) ×1 IMPLANT
SUT MNCRL 0 1X36 CT-1 (SUTURE) ×1 IMPLANT
SUT MON AB 2-0 CT1 36 (SUTURE) IMPLANT
SUT MONOCRYL 0 (SUTURE) ×2
SUT PROLENE 1 CT 1 30 (SUTURE) IMPLANT
SUT VIC AB 0 CT1 36 (SUTURE) ×3 IMPLANT
SUT VIC AB 1 CT1 36 (SUTURE) ×6 IMPLANT
SUT VIC AB 2-0 CT1 (SUTURE) ×3 IMPLANT
SWAB CULTURE AMIES ANAERIB BLU (MISCELLANEOUS) IMPLANT
SYR 20CC LL (SYRINGE) ×3 IMPLANT
SYR 30ML LL (SYRINGE) IMPLANT
SYR 5ML 18GX1 1/2 (NEEDLE) ×3 IMPLANT
TIP COAXIAL FEMORAL CANAL (MISCELLANEOUS) IMPLANT
TIP FAN IRRIG PULSAVAC PLUS (DISPOSABLE) IMPLANT
TOWEL OR 17X26 4PK STRL BLUE (TOWEL DISPOSABLE) IMPLANT
TOWER CARTRIDGE SMART MIX (DISPOSABLE) IMPLANT
WRAPON POLAR PAD KNEE (MISCELLANEOUS) ×3

## 2016-12-12 NOTE — Transfer of Care (Signed)
Immediate Anesthesia Transfer of Care Note  Patient: Travis Mills.  Procedure(s) Performed: KNEE ARTHROTOMY AND OSTECTOMY, POSSIBLE POLY EXCHANGE (Left Knee)  Patient Location: PACU  Anesthesia Type:Spinal  Level of Consciousness: awake and alert   Airway & Oxygen Therapy: Patient connected to face mask oxygen  Post-op Assessment: Post -op Vital signs reviewed and stable  Post vital signs: stable  Last Vitals:  Vitals:   12/12/16 1405 12/12/16 1753  BP: (!) 138/97 116/84  Pulse: 79 81  Resp: 19 16  Temp: 36.9 C 36.7 C  SpO2: 97% 95%    Last Pain:  Vitals:   12/12/16 1753  TempSrc: Temporal  PainSc:       Patients Stated Pain Goal: 2 (26/94/85 4627)  Complications: No apparent anesthesia complications

## 2016-12-12 NOTE — Anesthesia Post-op Follow-up Note (Signed)
Anesthesia QCDR form completed.        

## 2016-12-12 NOTE — Discharge Instructions (Signed)
°  Instructions after Total Knee Replacement   Gentry P. Holley Bouche., M.D.     Dept. of [facility]  [address], [place]  [id number]  Phone: [phone]   Fax: [phone]    DIET:  Drink plenty of non-alcoholic fluids.  Resume your normal diet. Include foods high in fiber.  ACTIVITY:   You may use crutches or a walker with weight-bearing as tolerated, unless instructed otherwise.  You may be weaned off of the walker or crutches as toleratedt.   Do NOT place pillows under the knee. Anything placed under the knee could limit your ability to straighten the knee.    Continue doing gentle exercises. Exercising will reduce the pain and swelling, increase motion, and prevent muscle weakness.    Please continue to use the TED compression stockings for 6 weeks. You may remove the stockings at night, but should reapply them in the morning.  Do not drive or operate any equipment until instructed.  WOUND CARE:   Continue to use the PolarCare or ice packs periodically to reduce pain and swelling.  You may bathe or shower after the staples are removed at the first office visit following surgery.  MEDICATIONS:  You may resume your regular medications.  Please take the pain medication as prescribed on the medication.  Do not take pain medication on an empty stomach.  You have been given a prescription for a blood thinner (Lovenox or Coumadin). Please take the medication as instructed. (NOTE: After completing a 2 week course of Lovenox, take one Enteric-coated aspirin once a day. This along with elevation will help reduce the possibility of phlebitis in your operated leg.)  Do not drive or drink alcoholic beverages when taking pain medications.  CALL THE OFFICE FOR:  Temperature above 101 degrees  Excessive bleeding or drainage on the dressing.  Excessive swelling, coldness, or paleness of the toes.  Persistent nausea and  vomiting.  FOLLOW-UP:   You should have an appointment to return to the office in 7-10 days after surgery.

## 2016-12-12 NOTE — Anesthesia Preprocedure Evaluation (Addendum)
Anesthesia Evaluation  Patient identified by MRN, date of birth, ID band Patient awake    Reviewed: Allergy & Precautions, NPO status , Patient's Chart, lab work & pertinent test results  History of Anesthesia Complications (+) PONV and history of anesthetic complications  Airway Mallampati: II  TM Distance: >3 FB Neck ROM: Full    Dental no notable dental hx.    Pulmonary sleep apnea and Continuous Positive Airway Pressure Ventilation , neg COPD,    breath sounds clear to auscultation- rhonchi (-) wheezing      Cardiovascular Exercise Tolerance: Good (-) hypertension(-) CAD and (-) Past MI negative cardio ROS  + dysrhythmias  Rhythm:Regular Rate:Normal - Systolic murmurs and - Diastolic murmurs    Neuro/Psych PSYCHIATRIC DISORDERS Anxiety Depression negative neurological ROS     GI/Hepatic Neg liver ROS, GERD  Medicated,  Endo/Other  negative endocrine ROSneg diabetes  Renal/GU negative Renal ROS     Musculoskeletal  (+) Arthritis ,   Abdominal (+) + obese,   Peds negative pediatric ROS (+)  Hematology negative hematology ROS (+)   Anesthesia Other Findings Past Medical History: 2009: Acute gallstone pancreatitis No date: Anxiety No date: Arthritis No date: Cancer [facility])     Comment: Basal Cell Skin Cancer No date: Chronic cough No date: Complication of anesthesia     Comment: heart block during surgery around 2014 poss               r/t lithium No date: Cough     Comment: CHRONIC No date: Depression No date: Environmental and seasonal allergies No date: GERD (gastroesophageal reflux disease)     Comment: occasionally No date: Heart block     Comment: complete heart block in 2014, thought to be               due to lithium No date: History of kidney stones No date: HOH (hard of hearing)     Comment: AIDS No date: Hyperlipidemia No date: Irritable bowel syndrome (IBS)     Comment: welchol has  improved bowel problems No date: PONV (postoperative nausea and vomiting) No date: Sleep apnea     Comment: CPAP   Reproductive/Obstetrics                            Anesthesia Physical  Anesthesia Plan  ASA: III  Anesthesia Plan: Spinal   Post-op Pain Management:    Induction: Intravenous  PONV Risk Score and Plan:   Airway Management Planned: Nasal Cannula  Additional Equipment:   Intra-op Plan:   Post-operative Plan:   Informed Consent: I have reviewed the patients History and Physical, chart, labs and discussed the procedure including the risks, benefits and alternatives for the proposed anesthesia with the patient or authorized representative who has indicated his/her understanding and acceptance.   Dental advisory given  Plan Discussed with: CRNA and Anesthesiologist  Anesthesia Plan Comments:       Anesthesia Quick Evaluation

## 2016-12-12 NOTE — Anesthesia Procedure Notes (Signed)
Spinal  Patient location during procedure: OR Start time: 12/12/2016 3:18 PM End time: 12/12/2016 3:22 PM Staffing Anesthesiologist: Andria Frames Resident/CRNA: Dionne Bucy Performed: resident/CRNA  Preanesthetic Checklist Completed: patient identified, site marked, surgical consent, pre-op evaluation, timeout performed, IV checked, risks and benefits discussed and monitors and equipment checked Spinal Block Patient position: sitting Prep: Betadine Patient monitoring: heart rate, continuous pulse ox, blood pressure and cardiac monitor Approach: midline Location: L4-5 Injection technique: single-shot Needle Needle type: Introducer and Pencan  Needle gauge: 24 G Needle length: 10 cm Assessment Sensory level: T10 Additional Notes Negative paresthesia. Negative blood return. Positive free-flowing CSF. Expiration date of kit checked and confirmed. Patient tolerated procedure well, without complications.

## 2016-12-12 NOTE — H&P (Signed)
The patient has been re-examined, and the chart reviewed, and there have been no interval changes to the documented history and physical.    The risks, benefits, and alternatives have been discussed at length. The patient expressed understanding of the risks benefits and agreed with plans for surgical intervention.  Jakwan P. Tobie Perdue, Jr. M.D.    

## 2016-12-12 NOTE — Op Note (Signed)
OPERATIVE NOTE  DATE OF SURGERY:  12/12/2016  PATIENT NAME:  Travis Mills.   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   ASSISTANT: None  ANESTHESIA: spinal  ESTIMATED BLOOD LOSS: 25 mL  FLUIDS REPLACED: 1700 mL of crystalloid  TOURNIQUET TIME: 55   DRAINS: None  IMPLANTS UTILIZED: None  INDICATIONS FOR SURGERY: [name] is a 71 y.o. year old male who has been seen for complaints of left infrapatellar pain.  He underwent left total knee arthroplasty over a year ago and recent radiographs demonstrated an osteophyte/exostosis emanating from the inferior pole of the patella.  A previous attempt at arthroscopic debridement was unsuccessful.. After discussion of the risks and benefits of surgical intervention, the patient expressed understanding of the risks benefits and agree with plans for left knee arthroscopy and removal of the infrapatellar osteophyte/exostosis.   PROCEDURE IN DETAIL: The patient was brought into the operating room and, after adequate spinal anesthesia was achieved, a tourniquet was placed on the patient's upper left thigh.  Patient's left knee and leg were cleaned and prepped with alcohol and DuraPrep and draped in the usual sterile fashion.  A "timeout" was performed as per usual protocol.  The left lower extremity was exsanguinated using an Esmarch, and the tourniquet was inflated to 300 mmHg.  Anterior longitudinal incision was made followed by a medial parapatellar arthrotomy.  The patella was visualized and the patellar component was in good condition.  There was a firm palpable mass emanating from the inferior pole of the patella.  Using electrocautery.  The fat pad was carefully debrided and dissected from  around the lesion and then a periosteal elevator was used to peel the osteochondral lesion off of the patellar ligament.  The osteochondral lesion was noted to be 3 cm x 1.5 cm.  It was subsequently submitted to pathology for permanent sections.  A rongeur was used to contour of the inferior aspect of the patella supposed to remove any residual bony prominences.  The wound was irrigated with copious amounts of normal saline with enema solution and suctioned dry.  Tourniquet was deflated after total tourniquet time of 55 minutes.  Good hemostasis was appreciated using electrocautery.  The medial parapatellar incision was reapproximated in layers using first #1 Vicryl.  Subcutaneous tissue was approximately using first #0 Vicryl followed by #2-0 Vicryl.  Skin was closed with skin staples.  Sterile dressing was applied.  The patient tolerated the procedure well.  He was transported to the recovery room in stable condition.   [name] M.D.

## 2016-12-13 ENCOUNTER — Encounter: Payer: Self-pay | Admitting: Orthopedic Surgery

## 2016-12-13 DIAGNOSIS — M25762 Osteophyte, left knee: Secondary | ICD-10-CM | POA: Diagnosis not present

## 2016-12-13 MED ORDER — TRAMADOL HCL 50 MG PO TABS: 50.0000 mg | ORAL_TABLET | ORAL | 0 refills | Status: DC | PRN

## 2016-12-13 MED ORDER — OXYCODONE HCL 5 MG PO TABS: 5.0000 mg | ORAL_TABLET | ORAL | 0 refills | Status: DC | PRN

## 2016-12-13 NOTE — Progress Notes (Signed)
Patient discharged home with wife via w/c in private vehicle. D/C instructions reviewed with patient and wife. Questions answered and follow up appointments given. IV removed without complications.

## 2016-12-13 NOTE — Discharge Summary (Signed)
Physician Discharge Summary  Patient ID: Travis Mills. MRN: [id number] DOB/AGE: 16-May-1945 71 y.o.  Admit date: 12/12/2016 Discharge date: 12/13/2016  Admission Diagnoses:  PATELLAR TENDINITIS OF LEFT KNEE   Discharge Diagnoses: Patient Active Problem List   Diagnosis Date Noted  . Bodies, loose, knee 12/12/2016  . S/P total knee arthroplasty 06/15/2015    Past Medical History:  Diagnosis Date  . Acute gallstone pancreatitis 2009  . Anxiety   . Arthritis   . Cancer ([place])    Basal Cell Skin Cancer  . Chronic cough   . Complication of anesthesia    heart block during surgery around 2014 poss r/t lithium  . Cough    CHRONIC  . Depression   . Environmental and seasonal allergies   . GERD (gastroesophageal reflux disease)    occasionally  . 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 History of kidney stones   . HOH (hard of hearing)    AIDS  . Hyperlipidemia   . Irritable bowel syndrome (IBS)    welchol has improved bowel problems  . Knee pain    Left  . PONV (postoperative nausea and vomiting)   . Sleep apnea    CPAP     Transfusion: No transfusions during this admission   Consultants (if any):   Discharged Condition: Improved  Hospital Course: Travis Mills. is an 71 y.o. male who was admitted 12/12/2016 with a diagnosis of patella tendinitis secondary to large osteophyte formation to the inferior pole of patella and went to the operating room on 12/12/2016 and underwent the above named procedures.    Surgeries:Procedure(s): KNEE ARTHROTOMY AND OSTECTOMY, POSSIBLE POLY EXCHANGE on 12/12/2016   Patient tolerated the surgery well. No complications .Patient was taken to PACU where she was stabilized and then transferred to the orthopedic floor.  Patient started on 81 mg enteric-coated aspirin. Heels elevated off bed with rolled towels. No evidence of DVT. Calves non tender. Negative Homan. Physical therapy  started on day #1 for gait training and transfer with OT starting on  day #1 for ADL and assisted devices. Patient has done well with therapy.    Patient's IV was discontinued on day #1 .   He was given perioperative antibiotics:  Anti-infectives    Start     Dose/Rate Route Frequency Ordered Stop   12/12/16 2130  ceFAZolin (ANCEF) 2 g in dextrose 5 % 100 mL IVPB     2 g 240 mL/hr over 30 Minutes Intravenous Every 6 hours 12/12/16 2048 12/13/16 2129   12/12/16 1354  ceFAZolin (ANCEF) 2-4 GM/100ML-% IVPB    Comments:  Travis Mills   : cabinet override      12/12/16 1354 12/12/16 1526   12/12/16 0600  ceFAZolin (ANCEF) IVPB 2g/100 mL premix     2 g 200 mL/hr over 30 Minutes Intravenous On call to O.R. 12/11/16 2316 12/12/16 1556    .  He was fitted with AV 1 compression foot pump devices, instructed on heel pumps, early ambulation, and fitted with TED stockings bilaterally for DVT prophylaxis.  He benefited maximally from the hospital stay and there were no complications.    Recent vital signs:  Vitals:   12/13/16 0200 12/13/16 0339  BP: 110/67 106/65  Pulse: 64 72  Resp: 16 16  Temp:    SpO2: 96% 95%    Recent laboratory studies:  Lab Results  Component Value Date  HGB 13.4 06/17/2015   HGB 12.4 (L) 06/16/2015   HGB 14.6 05/30/2015   Lab Results  Component Value Date   WBC 10.6 06/17/2015   PLT 217 06/17/2015   Lab Results  Component Value Date   INR 1.03 05/30/2015   Lab Results  Component Value Date   NA 140 06/17/2015   K 4.0 06/17/2015   CL 108 06/17/2015   CO2 24 06/17/2015   BUN 12 06/17/2015   CREATININE 0.77 06/17/2015   GLUCOSE 126 (H) 06/17/2015    Discharge Medications:   Allergies as of 12/13/2016   No Known Allergies     Medication List    TAKE these medications   amoxicillin 500 MG capsule Commonly known as:  AMOXIL Take 2,000 mg by mouth See admin instructions. Take 2000 mg by mouth 1 hour prior to dental procedure   aspirin EC  81 MG tablet Take 81 mg by mouth at bedtime.   colesevelam 625 MG tablet Commonly known as:  WELCHOL Take 625 mg by mouth daily as needed (for ibs related to patient diet).   diclofenac 0.1 % ophthalmic solution Commonly known as:  VOLTAREN 4 (four) times daily.   diclofenac sodium 1 % Gel Commonly known as:  VOLTAREN Apply 2 g topically 4 (four) times daily.   esomeprazole 20 MG capsule Commonly known as:  NEXIUM Take 20 mg by mouth daily as needed (for indigestion).   fluticasone 50 MCG/ACT nasal spray Commonly known as:  FLONASE Place 2 sprays into both nostrils daily as needed for allergies or rhinitis.   ibuprofen 800 MG tablet Commonly known as:  ADVIL,MOTRIN Take 800 mg by mouth every 8 (eight) hours as needed (for knee pain.).   OPTI-FREE PUREMOIST REWETTING Soln 1 drop by Does not apply route as needed.   oxyCODONE 5 MG immediate release tablet Commonly known as:  Oxy IR/ROXICODONE Take 1 tablet (5 mg total) by mouth every 3 (three) hours as needed for moderate pain ((score 4 to 6)).   pravastatin 20 MG tablet Commonly known as:  PRAVACHOL Take 20 mg by mouth at bedtime.   sertraline 50 MG tablet Commonly known as:  ZOLOFT Take 25 mg by mouth at bedtime.   sildenafil 20 MG tablet Commonly known as:  REVATIO Take 40-60 mg by mouth daily as needed (for erectile dysfunction).   tetrahydrozoline 0.05 % ophthalmic solution Place 1 drop into both eyes 3 (three) times daily as needed (for pollen/irritated eyes). OPTI-CLEAR   traMADol 50 MG tablet Commonly known as:  ULTRAM Take 1-2 tablets (50-100 mg total) by mouth every 4 (four) hours as needed for moderate pain.   VISINE-A 0.025-0.3 % ophthalmic solution Generic drug:  naphazoline-pheniramine Place 1 drop into both eyes 4 (four) times daily as needed for eye irritation.            Durable Medical Equipment        Start     Ordered   12/12/16 2049  DME Walker rolling  Once    Question:  Patient  needs a walker to treat with the following condition  Answer:  Total knee replacement status   12/12/16 2048   12/12/16 2049  DME Bedside commode  Once    Question:  Patient needs a bedside commode to treat with the following condition  Answer:  Total knee replacement status   12/12/16 2048      Diagnostic Studies: No results found.  Disposition: 01-Home or Self Care  Discharge Instructions  Diet - low sodium heart healthy    Complete by:  As directed    Increase activity slowly    Complete by:  As directed       Follow-up Information    Watt Climes, PA On 12/20/2016.   Specialty:  Physician Assistant Why:  at 2:15pm Contact information: [place] [address] [phone]        Dereck Leep, MD On 01/24/2017.   Specialty:  Orthopedic Surgery Why:  at 9:45am Contact information: [place] [address] [phone]            Signed: Watt Climes. 12/13/2016, 7:15 AM

## 2016-12-13 NOTE — Progress Notes (Signed)
Chaplain responded to an OR for Advanced Directive of a pt in [id number]. [place] educated pt on AD, pt indicated that he will review material and contact [place] when ready to complete AD. [place] informed pt that [facility] services are available as needed.   12/13/16 1000  Clinical Encounter Type  Visited With Patient  Visit Type Initial;Other (Comment)  Referral From Nurse  Consult/Referral To Chaplain  Spiritual Encounters  Spiritual Needs Literature

## 2016-12-13 NOTE — Progress Notes (Signed)
   Subjective: 1 Day Post-Op Procedure(s) (LRB): KNEE ARTHROTOMY AND OSTECTOMY, POSSIBLE POLY EXCHANGE (Left) Patient reports pain as mild.   Patient is well, and has had no acute complaints or problems We will start therapy today. Patient has already started up today with minimal pain. Plan is to go Home after hospital stay. no nausea and no vomiting Patient denies any chest pains or shortness of breath. Objective: Vital signs in last 24 hours: Temp:  [97 F (36.1 C)-98.5 F (36.9 C)] 98 F (36.7 C) (10/31 2359) Pulse Rate:  [48-81] 72 (11/01 0339) Resp:  [13-22] 16 (11/01 0339) BP: (105-140)/(65-97) 106/65 (11/01 0339) SpO2:  [95 %-100 %] 95 % (11/01 0339) Weight:  [108 kg (238 lb)] 108 kg (238 lb) (10/31 1405) Heels are non tender and elevated off the bed using rolled towels Intake/Output from previous day: 10/31 0701 - 11/01 0700 In: 3265 [I.V.:2825; IV Piggyback:440] Out: 2025 [Urine:2000; Blood:25] Intake/Output this shift: No intake/output data recorded.  No results for input(s): HGB in the last 72 hours. No results for input(s): WBC, RBC, HCT, PLT in the last 72 hours. No results for input(s): NA, K, CL, CO2, BUN, CREATININE, GLUCOSE, CALCIUM in the last 72 hours. No results for input(s): LABPT, INR in the last 72 hours.  EXAM General - Patient is Alert, Appropriate and Oriented Extremity - Neurologically intact Neurovascular intact Sensation intact distally Intact pulses distally Dorsiflexion/Plantar flexion intact Compartment soft Dressing - dressing C/D/I Motor Function - intact, moving foot and toes well on exam.    Past Medical History:  Diagnosis Date  . Acute gallstone pancreatitis 2009  . Anxiety   . Arthritis   . Cancer ([place])    Basal Cell Skin Cancer  . Chronic cough   . Complication of anesthesia    heart block during surgery around 2014 poss r/t lithium  . Cough    CHRONIC  . Depression   . Environmental and seasonal allergies   . GERD  (gastroesophageal reflux disease)    occasionally  . 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 History of kidney stones   . HOH (hard of hearing)    AIDS  . Hyperlipidemia   . Irritable bowel syndrome (IBS)    welchol has improved bowel problems  . Knee pain    Left  . PONV (postoperative nausea and vomiting)   . Sleep apnea    CPAP    Assessment/Plan: 1 Day Post-Op Procedure(s) (LRB): KNEE ARTHROTOMY AND OSTECTOMY, POSSIBLE POLY EXCHANGE (Left) Active Problems:   Bodies, loose, knee  Estimated body mass index is 32.28 kg/m as calculated from the following:   Height as of this encounter: 6' (1.829 m).   Weight as of this encounter: 108 kg (238 lb). Advance diet Up with therapy Discharge home with home health  Labs: None DVT Prophylaxis - Aspirin and TED hose Weight-Bearing as tolerated to left leg Patient may be discharged to home once this morning  Girl Schissler R. Hoffman Moro 12/13/2016, 7:10 AM

## 2016-12-13 NOTE — Care Management Obs Status (Signed)
[place] NOTIFICATION   Patient Details  Name: Travis Mills. MRN: [id number] Date of Birth: May 09, 1945   Medicare Observation Status Notification Given:  Yes    Jolly Mango, RN 12/13/2016, 9:12 AM

## 2016-12-13 NOTE — Care Management Note (Signed)
Case Management Note  Patient Details  Name: Travis Mills. MRN: [id number] Date of Birth: 02-Jun-1945  Subjective/Objective:                   Pt recommending  Outpatient PT.  Patient prefers [facility]. Appointment scheduled for Monday Nov. 5 at 4 pm with [place]. Patient updated on date and time.   Action/Plan:   Expected Discharge Date:  12/13/16               Expected Discharge Plan:  OP Rehab  In-House Referral:     Discharge planning Services  CM Consult  Post Acute Care Choice:    Choice offered to:     DME Arranged:    DME Agency:     HH Arranged:    HH Agency:     Status of Service:  Completed, signed off  If discussed at [employer] of Stay Meetings, dates discussed:    Additional Comments:  Jolly Mango, RN 12/13/2016, 11:03 AM

## 2016-12-13 NOTE — Anesthesia Postprocedure Evaluation (Signed)
Anesthesia Post Note  Patient: Travis Mills.  Procedure(s) Performed: KNEE ARTHROTOMY AND OSTECTOMY, POSSIBLE POLY EXCHANGE (Left Knee)  Patient location during evaluation: Nursing Unit Anesthesia Type: Spinal Level of consciousness: awake, awake and alert and oriented Pain management: pain level controlled Vital Signs Assessment: post-procedure vital signs reviewed and stable Respiratory status: spontaneous breathing, nonlabored ventilation and respiratory function stable Cardiovascular status: blood pressure returned to baseline and stable Postop Assessment: no headache, no backache, patient able to bend at knees, no apparent nausea or vomiting and adequate PO intake Anesthetic complications: no     Last Vitals:  Vitals:   12/13/16 0200 12/13/16 0339  BP: 110/67 106/65  Pulse: 64 72  Resp: 16 16  Temp:    SpO2: 96% 95%    Last Pain:  Vitals:   12/13/16 0530  TempSrc:   PainSc: 0-No pain                 Safira Proffit,  Brenden Rudman R

## 2016-12-13 NOTE — Evaluation (Signed)
Physical Therapy Evaluation Patient Details Name: Travis Mills. MRN: [id number] DOB: 1945/12/31 Today's Date: 12/13/2016   History of Present Illness  Pt is a 71 y/o M who s/p L knee arthrotomy and excision of left infrapatellar osteophyte.  Pt's PMH includes Bil TKA, skin cancer.     Clinical Impression  Patient is s/p above surgery resulting in functional limitations due to the deficits listed below (see PT Problem List). Mr. Gershman is moving well following procedure above.  He ambulated 200 ft without AD and no LOB.  He completed stair training with supervision for safety.  Pt  Patient will benefit from skilled PT to increase their independence and safety with mobility to allow discharge to the venue listed below.      Follow Up Recommendations Outpatient PT    Equipment Recommendations  None recommended by PT    Recommendations for Other Services       Precautions / Restrictions Precautions Precautions: Fall Required Braces or Orthoses: Knee Immobilizer - Left Knee Immobilizer - Left:  (if pt unable to perform SLR) Restrictions Weight Bearing Restrictions: Yes LLE Weight Bearing: Weight bearing as tolerated      Mobility  Bed Mobility Overal bed mobility: Independent             General bed mobility comments: No physical assist or cues needed.  Pt performs independently.   Transfers Overall transfer level: Modified independent Equipment used: None             General transfer comment: Pt slow to stand but no LOB or instability noted. Pt controls descent to sit well.   Ambulation/Gait Ambulation/Gait assistance: Min guard Ambulation Distance (Feet): 200 Feet Assistive device: None Gait Pattern/deviations: Step-through pattern;Decreased stance time - left;Decreased step length - right;Decreased weight shift to left;Antalgic;Wide base of support Gait velocity: mildly decreased Gait velocity interpretation: Below normal speed for age/gender General  Gait Details: Pt ambulates with mildly wide base of support and very mild instability but no LOB and no physical assist needed.    Stairs Stairs: Yes Stairs assistance: Supervision Stair Management: One rail Left;Step to pattern;Forwards Number of Stairs: 4 General stair comments: Supervision for safety.  Cues for sequencing and technique.  No instability noted.   Wheelchair Mobility    Modified Rankin (Stroke Patients Only)       Balance Overall balance assessment: Needs assistance Sitting-balance support: No upper extremity supported;Feet supported Sitting balance-Leahy Scale: Good     Standing balance support: No upper extremity supported;During functional activity Standing balance-Leahy Scale: Fair Standing balance comment: Pt likely would lose his balance with perturbation but is able to complete static standing and ambulation without UE support                             Pertinent Vitals/Pain Pain Assessment: 0-10 Pain Score: 5  Pain Location: L knee Pain Descriptors / Indicators: Aching;Discomfort Pain Intervention(s): Limited activity within patient's tolerance;Monitored during session;Repositioned    Home Living Family/patient expects to be discharged to:: Private residence Living Arrangements: Spouse/significant other Available Help at Discharge: Family;Available 24 hours/day Type of Home: House Home Access: Stairs to enter Entrance Stairs-Rails: Left Entrance Stairs-Number of Steps: 4 Home Layout: One level Home Equipment: Walker - 2 wheels;Cane - single point;Bedside commode;Grab bars - tub/shower;Hand held shower head;Shower seat      Prior Function Level of Independence: Independent         Comments: Pt works part time  at a funeral home but 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  He ambualtes without AD.  No falls in the past 6 months.  Independent with all ADLs.       Hand Dominance        Extremity/Trunk  Assessment   Upper Extremity Assessment Upper Extremity Assessment: Overall WFL for tasks assessed    Lower Extremity Assessment Lower Extremity Assessment: LLE deficits/detail LLE Deficits / Details: L knee AROM limited due to stiffness and bandaging.  Strength grossly at least 3/5    Cervical / Trunk Assessment Cervical / Trunk Assessment: Normal  Communication   Communication: No difficulties  Cognition Arousal/Alertness: Awake/alert Behavior During Therapy: WFL for tasks assessed/performed Overall Cognitive Status: Within Functional Limits for tasks assessed                                        General Comments      Exercises General Exercises - Lower Extremity Ankle Circles/Pumps: AROM;Both;10 reps;Supine Heel Slides: Strengthening;Left;10 reps;Supine Straight Leg Raises: Strengthening;Left;10 reps;Supine Mini-Sqauts: Strengthening;Both;5 reps;Standing   Assessment/Plan    PT Assessment Patient needs continued PT services  PT Problem List Decreased strength;Decreased range of motion;Decreased balance;Decreased safety awareness;Pain       PT Treatment Interventions DME instruction;Gait training;Stair training;Functional mobility training;Therapeutic activities;Therapeutic exercise;Neuromuscular re-education;Balance training;Patient/family education;Modalities    PT Goals (Current goals can be found in the Care Plan section)  Acute Rehab PT Goals Patient Stated Goal: to go home and return to PLOF PT Goal Formulation: With patient Time For Goal Achievement: 12/27/16 Potential to Achieve Goals: Good    Frequency BID   Barriers to discharge        Co-evaluation               AM-PAC PT "6 Clicks" Daily Activity  Outcome Measure Difficulty turning over in bed (including adjusting bedclothes, sheets and blankets)?: None Difficulty moving from lying on back to sitting on the side of the bed? : None Difficulty sitting down on and standing  up from a chair with arms (e.g., wheelchair, bedside commode, etc,.)?: A Little Help needed moving to and from a bed to chair (including a wheelchair)?: A Little Help needed walking in hospital room?: A Little Help needed climbing 3-5 steps with a railing? : A Little 6 Click Score: 20    End of Session Equipment Utilized During Treatment: Gait belt Activity Tolerance: Patient tolerated treatment well Patient left: in bed;with call bell/phone within reach;with bed alarm set;with family/visitor present;Other (comment) (pt sitting EOB) Nurse Communication: Mobility status PT Visit Diagnosis: Pain;Unsteadiness on feet (R26.81);Muscle weakness (generalized) (M62.81) Pain - Right/Left: Left Pain - part of body: Knee    Time: 9622-2979 PT Time Calculation (min) (ACUTE ONLY): 19 min   Charges:   PT Evaluation $PT Eval Low Complexity: 1 Low     PT G Codes:   PT G-Codes **NOT FOR INPATIENT CLASS** Functional Assessment Tool Used: AM-PAC 6 Clicks Basic Mobility;Clinical judgement Functional Limitation: Mobility: Walking and moving around Mobility: Walking and Moving Around Current Status (G9211): At least 20 percent but less than 40 percent impaired, limited or restricted Mobility: Walking and Moving Around Goal Status [phone]): At least 1 percent but less than 20 percent impaired, limited or restricted    Collie Siad PT, DPT 12/13/2016, 11:49 AM

## 2016-12-14 LAB — SURGICAL PATHOLOGY

## 2017-06-28 ENCOUNTER — Ambulatory Visit (INDEPENDENT_AMBULATORY_CARE_PROVIDER_SITE_OTHER): Payer: Medicare HMO | Admitting: Urology

## 2017-06-28 ENCOUNTER — Encounter: Payer: Self-pay | Admitting: Urology

## 2017-06-28 VITALS — BP 132/80 | HR 92 | Ht 72.0 in | Wt 242.6 lb

## 2017-06-28 DIAGNOSIS — N486 Induration penis plastica: Secondary | ICD-10-CM | POA: Diagnosis not present

## 2017-06-28 NOTE — Progress Notes (Signed)
06/28/2017 2:35 PM   Dianna Limbo. Jan 26, 1946 [id number]  Referring provider: Idelle Crouch, MD [facility] [place], [place] [address]  Chief Complaint  Patient presents with  . Establish Care  . 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  He has had a palpable black for several years however approximately 1 year ago noted curvature of the penis to the right with erections.  The curvature is at the distal shaft and he estimates at > 45, <90 degrees.  The curvature has been stable for at least 6 months.  He was having pain with erections which has almost resolved.  He denies hourglass deformity.  He does have some difficulty achieving and maintaining an erection and is using sildenafil.  There is no history of penile fracture.   PMH: Past Medical History:  Diagnosis Date  . Acute gallstone pancreatitis 2009  . Anxiety   . Arthritis   . Cancer ([place])    Basal Cell Skin Cancer  . Chronic cough   . Complication of anesthesia    heart block during surgery around 2014 poss r/t lithium  . Cough    CHRONIC  . Depression   . Environmental and seasonal allergies   . GERD (gastroesophageal reflux disease)    occasionally  . 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 History of kidney stones   . HOH (hard of hearing)    AIDS  . Hyperlipidemia   . Irritable bowel syndrome (IBS)    welchol has improved bowel problems  . Knee pain    Left  . PONV (postoperative nausea and vomiting)   . Sleep apnea    CPAP    Surgical History: Past Surgical History:  Procedure Laterality Date  . CATARACT EXTRACTION W/PHACO Left 04/04/2016   Procedure: CATARACT EXTRACTION PHACO AND INTRAOCULAR LENS PLACEMENT (IOC);  Surgeon: Estill Cotta, MD;  Location: [facility] ORS;  Service: Ophthalmology;  Laterality: Left;  [place]  01:25 AP% 21.3 CDE 30.15 Fluid pack lot # [id number]    . CATARACT EXTRACTION W/PHACO Right 05/09/2016   Procedure: CATARACT EXTRACTION PHACO AND INTRAOCULAR LENS PLACEMENT (IOC);  Surgeon: Estill Cotta, MD;  Location: [facility];  Service: Ophthalmology;  Laterality: Right;  US01:16.5 AP%23.9 CDE31.92 LOT #[id number]  . CHOLECYSTECTOMY    . COLONOSCOPY    . EYE SURGERY    . FOOT SURGERY Left    metal in foot  . HAMMER TOE SURGERY Bilateral   . HERNIA REPAIR N/A 2015   20 surgical screws holding mesh in abdomen after umbilical hernia  . HERNIA REPAIR    . JOINT REPLACEMENT Right 2009   knee  . KIDNEY STONE SURGERY    . KNEE ARTHROPLASTY Left 06/15/2015   Procedure: COMPUTER ASSISTED TOTAL KNEE ARTHROPLASTY;  Surgeon: Dereck Leep, MD;  Location: [facility];  Service: Orthopedics;  Laterality: Left;  . KNEE ARTHROSCOPY Right   . KNEE ARTHROSCOPY Left 07/16/2016   Procedure: ARTHROSCOPY KNEE;  Surgeon: Dereck Leep, MD;  Location: [facility];  Service: Orthopedics;  Laterality: Left;  . KNEE ARTHROTOMY Left 12/12/2016   Procedure: KNEE ARTHROTOMY AND OSTECTOMY, POSSIBLE POLY EXCHANGE;  Surgeon: Dereck Leep, MD;  Location: [facility];  Service: Orthopedics;  Laterality: Left;  . MOHS SURGERY Left    hand  . OSTECTOMY  07/16/2016   Procedure: OSTECTOMY;  Surgeon:  Hooten, Laurice Record, MD;  Location: [facility];  Service: Orthopedics;;  . TONSILLECTOMY     as a child    Home Medications:  Allergies as of 06/28/2017   No Known Allergies     Medication List        Accurate as of 06/28/17  2:35 PM. Always use your most recent med list.          aspirin EC 81 MG tablet Take 81 mg by mouth at bedtime.   pravastatin 20 MG tablet Commonly known as:  PRAVACHOL Take 20 mg by mouth at bedtime.   sertraline 50 MG tablet Commonly known as:  ZOLOFT Take 25 mg by mouth at bedtime.   sildenafil 20 MG tablet Commonly known as:  REVATIO Take 40-60 mg by mouth daily as needed (for erectile dysfunction).       Allergies: No Known  Allergies  Family History: Family History  Problem Relation Age of Onset  . Kidney cancer Mother   . Heart disease Father     Social History:  reports that he has never smoked. He has never used smokeless tobacco. He reports that he does not drink alcohol or use drugs.  ROS: UROLOGY Frequent Urination?: No Hard to postpone urination?: No Burning/pain with urination?: No Get up at night to urinate?: Yes Leakage of urine?: No Urine stream starts and stops?: Yes Trouble starting stream?: No Do you have to strain to urinate?: Yes Blood in urine?: No Urinary tract infection?: No Sexually transmitted disease?: No Injury to kidneys or bladder?: No Painful intercourse?: No Weak stream?: No Erection problems?: Yes Penile pain?: Yes  Gastrointestinal Nausea?: No Vomiting?: No Indigestion/heartburn?: No Diarrhea?: Yes Constipation?: No  Constitutional Fever: No Night sweats?: No Weight loss?: No Fatigue?: No  Skin Skin rash/lesions?: No Itching?: No  Eyes Blurred vision?: No Double vision?: No  Ears/Nose/Throat Sore throat?: No Sinus problems?: No  Hematologic/Lymphatic Swollen glands?: No Easy bruising?: No  Cardiovascular Leg swelling?: No Chest pain?: No  Respiratory Cough?: No Shortness of breath?: No  Endocrine Excessive thirst?: No  Musculoskeletal Back pain?: No Joint pain?: No  Neurological Headaches?: No Dizziness?: No  Psychologic Depression?: No Anxiety?: Yes  Physical Exam: BP 132/80 (BP Location: Left Arm, Patient Position: Sitting, Cuff Size: Large)   Pulse 92   Ht 6' (1.829 m)   Wt 242 lb 9.6 oz (110 kg)   SpO2 99%   BMI 32.90 kg/m   Constitutional:  Alert and oriented, No acute distress. HEENT: [place] AT, moist mucus membranes.  Trachea midline, no masses. Cardiovascular: No clubbing, cyanosis, or edema. Respiratory: Normal respiratory effort, no increased work of breathing. GI: Abdomen is soft, nontender, nondistended, no  abdominal masses GU: No CVA tenderness.  Penis is circumcised.  Stretched penile length is normal.  There is a dorsal plaque palpable from mid to distal shaft.  Testes descended bilaterally without masses or tenderness. Lymph: No cervical or inguinal lymphadenopathy. Skin: No rashes, bruises or suspicious lesions. Neurologic: Grossly intact, no focal deficits, moving all 4 extremities. Psychiatric: Normal mood and affect.  Laboratory Data: Lab Results  Component Value Date   WBC 10.6 06/17/2015   HGB 13.4 06/17/2015   HCT 39.1 (L) 06/17/2015   MCV 96.1 06/17/2015   PLT 217 06/17/2015    Lab Results  Component Value Date   CREATININE 0.77 06/17/2015      Assessment & Plan:   72 year old male with Peyronie's disease.   Treatment options and goals of treatment were discussed  today in detail. Options including observation, penile plaque and graft, penile plication, placement of penile prosthesis, and injection of collagenase were all reviewed.  An benefits of each were discussed at length.  He seems to be most interested in collagenase injections with the medication Xiaflex.  We discussed that this medication is administered in cycles which include 2 injections of the medication into the penile plaque followed by modeling procedure with 6 weeks of home exercises. Goals of treatment were reviewed which include improvement of penile curvature ideally to facilitate sexual function/penetration but likely not complete resolution of the curvature. Risks including failure of the medication, penile hematoma/edema, pain, and penile fracture were all discussed.   He was provided additional literature on Xiaflex.  He'll call us to let us know if it like to schedule cycle this medication and we will arrange for insurance investigation. He will need an induction of an erection with measurement as part of this evaluation.    Abbie Sons, [facility] [address] [place], [place] [address] [phone]

## 2017-08-08 ENCOUNTER — Telehealth: Payer: Self-pay | Admitting: Urology

## 2017-08-08 NOTE — Telephone Encounter (Signed)
Pt wanted to know a type of oral meds that Stoioff wanted to prescribe (I didn't see it in his notes).  Pt wanted to check with his insurance company to see if this was covered.  [phone]

## 2017-08-09 NOTE — Telephone Encounter (Signed)
Called pt informed him that there is no oral medication for peyronie's disease. Pt gave verbal understanding.

## 2017-10-09 ENCOUNTER — Other Ambulatory Visit: Payer: Self-pay | Admitting: Internal Medicine

## 2017-10-09 DIAGNOSIS — R1033 Periumbilical pain: Secondary | ICD-10-CM

## 2017-10-11 ENCOUNTER — Ambulatory Visit
Admission: RE | Admit: 2017-10-11 | Discharge: 2017-10-11 | Disposition: A | Discharge status: Home / Self Care | Payer: Medicare HMO | Source: Ambulatory Visit | Attending: Internal Medicine | Admitting: Internal Medicine

## 2017-10-11 DIAGNOSIS — R1033 Periumbilical pain: Secondary | ICD-10-CM | POA: Diagnosis present

## 2017-10-11 DIAGNOSIS — K760 Fatty (change of) liver, not elsewhere classified: Secondary | ICD-10-CM | POA: Diagnosis not present

## 2017-10-11 DIAGNOSIS — N200 Calculus of kidney: Secondary | ICD-10-CM | POA: Diagnosis not present

## 2018-10-08 ENCOUNTER — Other Ambulatory Visit: Payer: Self-pay | Admitting: Physician Assistant

## 2018-10-08 DIAGNOSIS — M25531 Pain in right wrist: Secondary | ICD-10-CM

## 2018-10-14 ENCOUNTER — Other Ambulatory Visit: Payer: Self-pay | Admitting: Internal Medicine

## 2018-10-14 DIAGNOSIS — R1084 Generalized abdominal pain: Secondary | ICD-10-CM

## 2018-10-22 ENCOUNTER — Other Ambulatory Visit: Payer: Self-pay

## 2018-10-22 ENCOUNTER — Ambulatory Visit
Admission: RE | Admit: 2018-10-22 | Discharge: 2018-10-22 | Disposition: A | Discharge status: Home / Self Care | Payer: Medicare HMO | Source: Ambulatory Visit | Attending: Physician Assistant | Admitting: Physician Assistant

## 2018-10-22 DIAGNOSIS — M25531 Pain in right wrist: Secondary | ICD-10-CM | POA: Insufficient documentation

## 2018-10-22 MED ORDER — GADOBUTROL 1 MMOL/ML IV SOLN
10.0000 mL | Freq: Once | INTRAVENOUS | Status: AC | PRN
  Administered 2018-10-22: 18:00:00 10 mL via INTRAVENOUS

## 2018-10-23 ENCOUNTER — Ambulatory Visit
Admission: RE | Admit: 2018-10-23 | Discharge: 2018-10-23 | Disposition: A | Discharge status: Home / Self Care | Payer: Medicare HMO | Source: Ambulatory Visit | Attending: Internal Medicine | Admitting: Internal Medicine

## 2018-10-23 ENCOUNTER — Other Ambulatory Visit: Payer: Self-pay

## 2018-10-23 DIAGNOSIS — R1084 Generalized abdominal pain: Secondary | ICD-10-CM | POA: Diagnosis not present

## 2018-10-24 IMAGING — US US ABDOMEN COMPLETE
1 series · 13 of 25 positions shown · non-contrast
Comparison: Renal ultrasound 05/03/2014. CT Abdomen and Pelvis
09/22/2012.

CLINICAL DATA: 72-year-old male with periumbilical abdominal pain.

EXAM:
ABDOMEN ULTRASOUND COMPLETE

[Series 1: us abdomen complete · 13 of 69 slices shown]
[im 1/69]
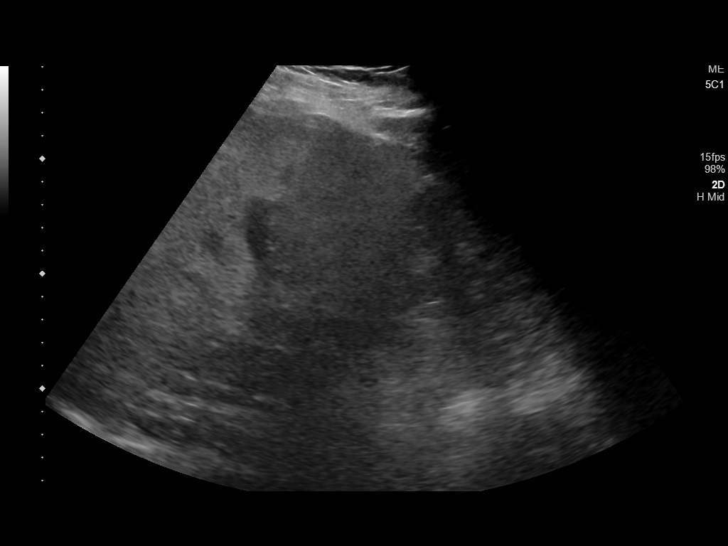
[im 6/69]
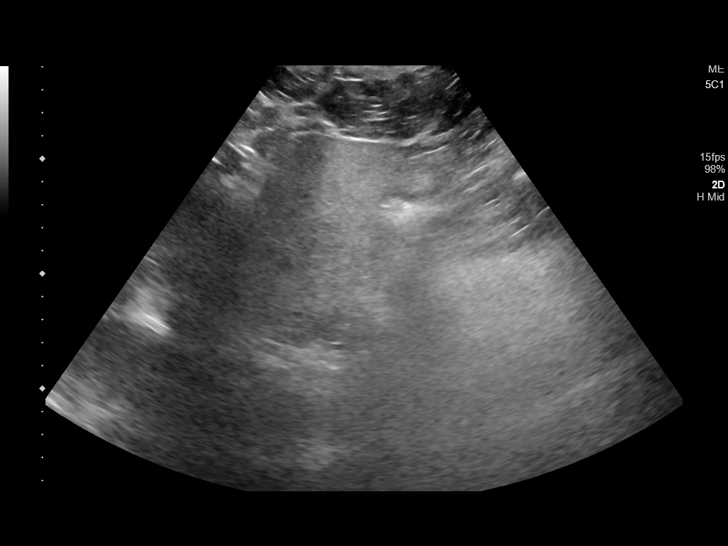
[im 12/69]
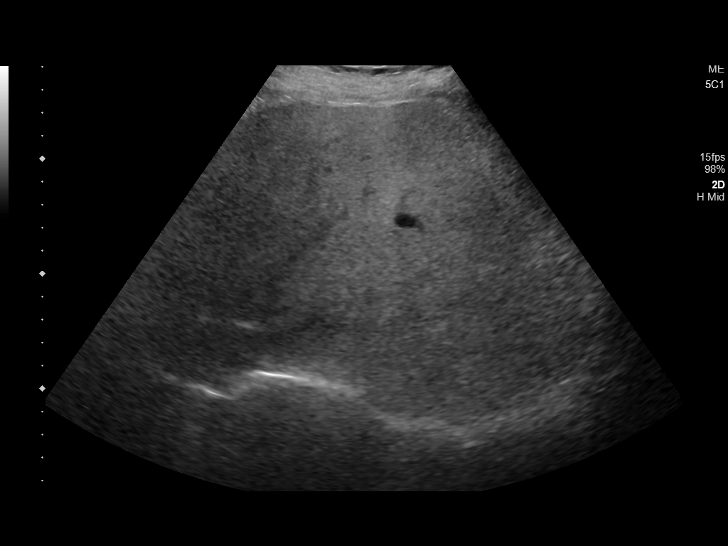
[im 18/69]
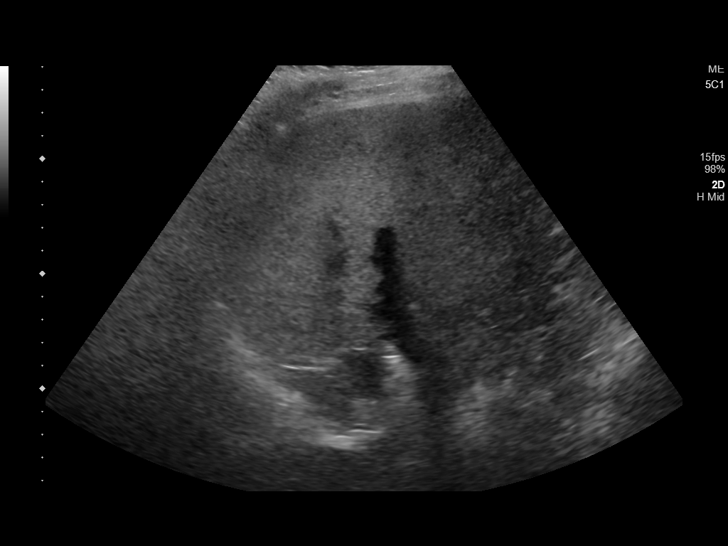
[im 23/69]
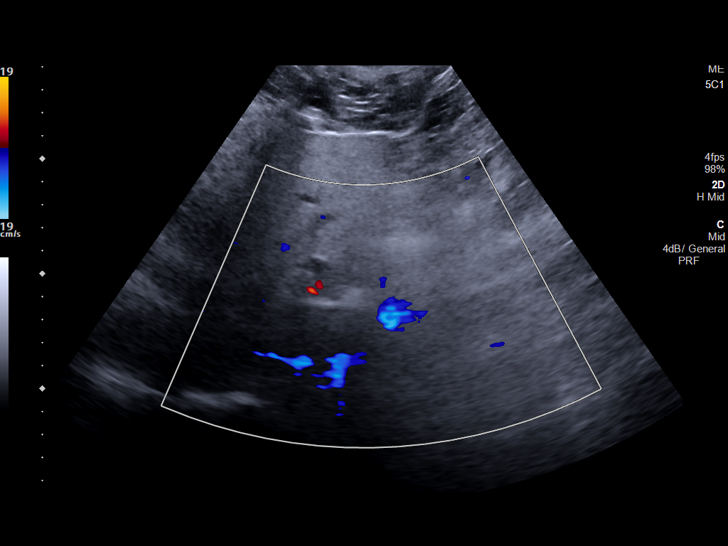
[im 29/69]
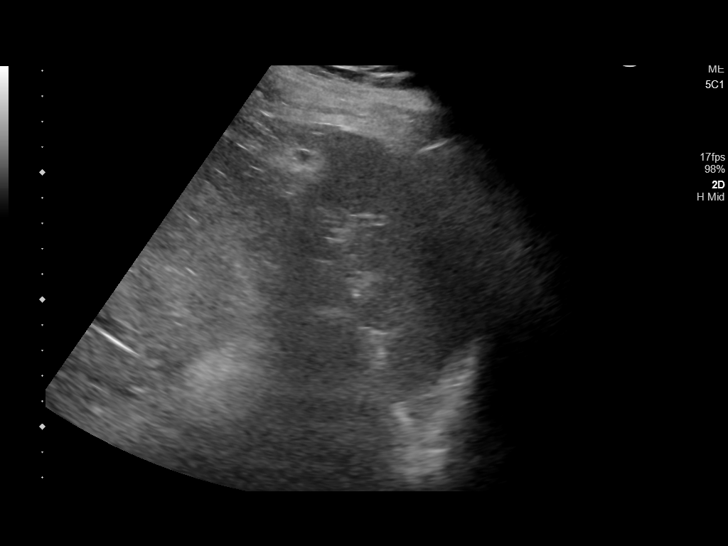
[im 35/69]
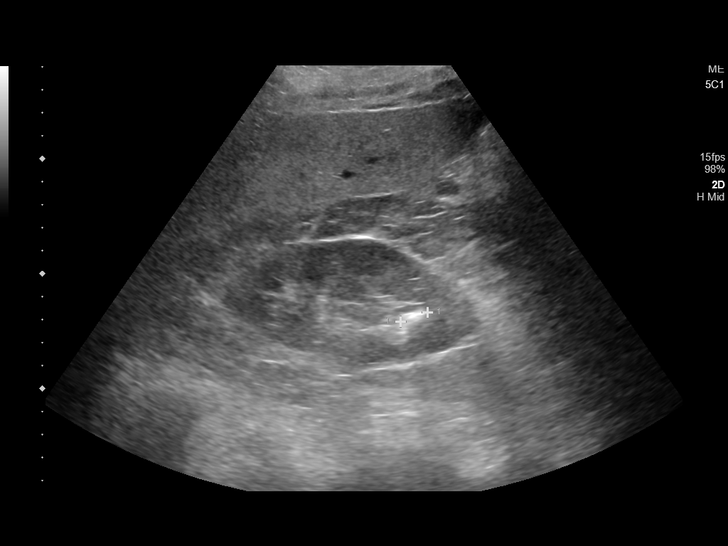
[im 40/69]
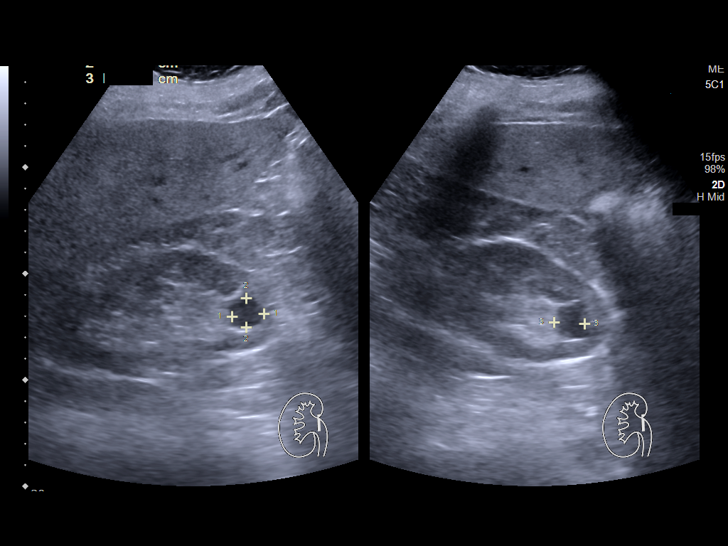
[im 46/69]
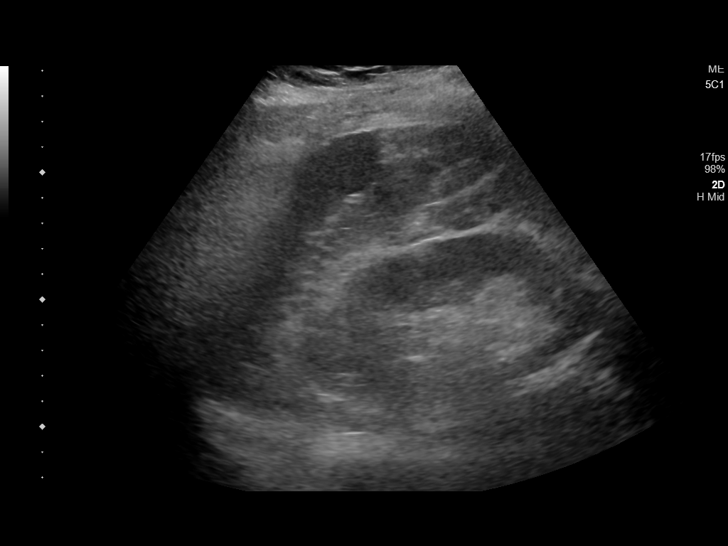
[im 52/69]
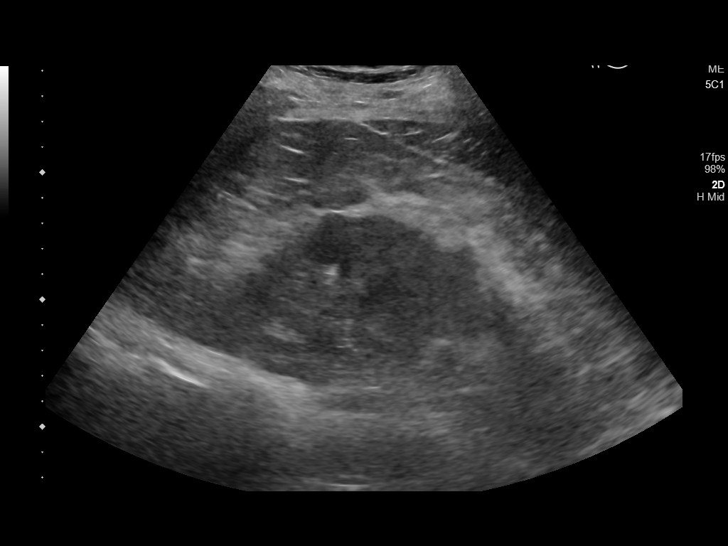
[im 57/69]
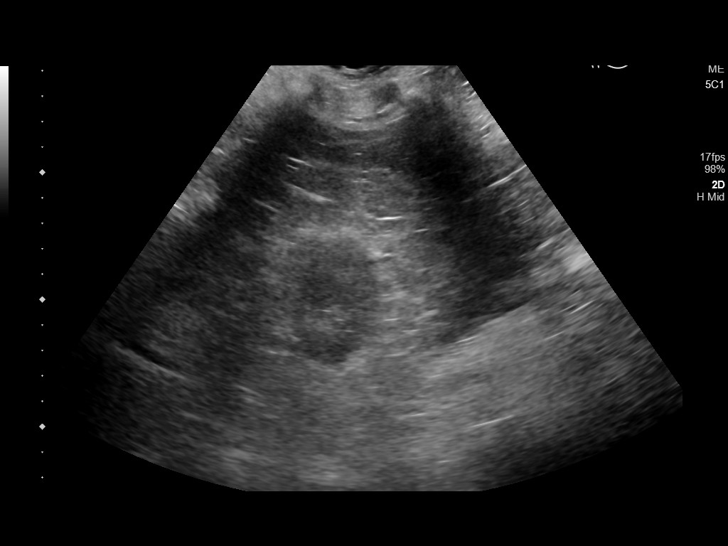
[im 63/69]
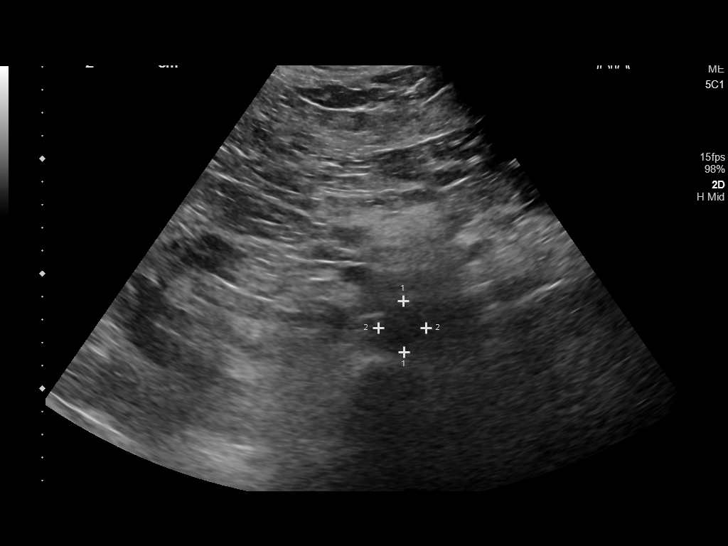
[im 69/69]
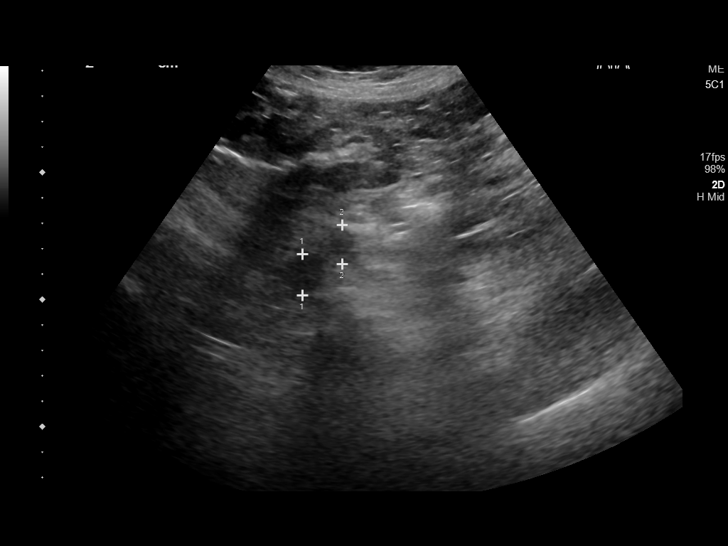

[13 of 25 positions shown; findings below may reference images not displayed]

FINDINGS: Gallbladder: Surgically absent as seen on the prior CT.

Common bile duct: Diameter: 6 millimeters, normal.

Liver: Echogenic liver, difficult to penetrate. Areas of geographic
heterogeneity are suspected (image 18) with no discrete liver
lesion. No intrahepatic biliary ductal dilatation. Portal vein is
patent on color Doppler imaging with normal direction of blood flow
towards the liver.

IVC: No abnormality visualized.

Pancreas: Visualized portion unremarkable.

Spleen: Size and appearance within normal limits.

Right Kidney: Length: 12.4 centimeters. Preserved cortical
echogenicity. Chronic lower pole nephrolithiasis and small benign
renal cyst appears stable since 0465. no hydronephrosis or solid
right renal mass.

Left Kidney: Length: 12.0 centimeters. No left nephrolithiasis is
evident today. Echogenicity within normal limits. No mass or
hydronephrosis visualized.

Abdominal aorta: No aneurysm visualized.

Other findings: None.
IMPRESSION: 1. Fatty liver disease, chronic when compared to the 7057 CT.
2. Chronic right nephrolithiasis, stable since [DATE].

## 2018-12-01 ENCOUNTER — Encounter: Payer: Self-pay | Admitting: Urology

## 2018-12-01 ENCOUNTER — Other Ambulatory Visit: Payer: Self-pay

## 2018-12-01 ENCOUNTER — Ambulatory Visit: Payer: Medicare HMO | Admitting: Urology

## 2018-12-01 VITALS — BP 129/80 | HR 84 | Ht 72.0 in | Wt 244.0 lb

## 2018-12-01 DIAGNOSIS — N200 Calculus of kidney: Secondary | ICD-10-CM

## 2018-12-01 DIAGNOSIS — F329 Major depressive disorder, single episode, unspecified: Secondary | ICD-10-CM | POA: Insufficient documentation

## 2018-12-01 DIAGNOSIS — F32A Depression, unspecified: Secondary | ICD-10-CM | POA: Insufficient documentation

## 2018-12-01 DIAGNOSIS — F419 Anxiety disorder, unspecified: Secondary | ICD-10-CM | POA: Insufficient documentation

## 2018-12-01 NOTE — Progress Notes (Signed)
12/01/2018 11:07 AM   Travis Mills 10-27-1945 BP:[id number]  Referring provider: Idelle Crouch, MD [facility] [place],  [place] [address]  Chief Complaint  Patient presents with  . Nephrolithiasis    HPI: 73 y.o. male referred by Dr. Doy Hutching for nephrolithiasis.  He underwent left ureteroscopic stone removal by Dr. Erlene Quan in 2016.  He undergoes periodic abdominal ultrasound for fatty liver.  Ultrasound performed 10/23/2018 showed bilateral nonobstructing lower pole calculi.  He is asymptomatic.  He was found to have lower pole calculi on an ultrasound in 2016.  He has no voiding complaints.  Denies gross hematuria.   PMH: Past Medical History:  Diagnosis Date  . Acute gallstone pancreatitis 2009  . Anxiety   . Arthritis   . Cancer ([place])    Basal Cell Skin Cancer  . Chronic cough   . Complication of anesthesia    heart block during surgery around 2014 poss r/t lithium  . Cough    CHRONIC  . Depression   . Environmental and seasonal allergies   . GERD (gastroesophageal reflux disease)    occasionally  . 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 History of kidney stones   . HOH (hard of hearing)    AIDS  . Hyperlipidemia   . Irritable bowel syndrome (IBS)    welchol has improved bowel problems  . Knee pain    Left  . PONV (postoperative nausea and vomiting)   . Sleep apnea    CPAP    Surgical History: Past Surgical History:  Procedure Laterality Date  . CATARACT EXTRACTION W/PHACO Left 04/04/2016   Procedure: CATARACT EXTRACTION PHACO AND INTRAOCULAR LENS PLACEMENT (IOC);  Surgeon: Estill Cotta, MD;  Location: [facility];  Service: Ophthalmology;  Laterality: Left;  [place]  01:25 AP% 21.3 CDE 30.15 Fluid pack lot # [id number]  . CATARACT EXTRACTION W/PHACO Right 05/09/2016   Procedure: CATARACT EXTRACTION PHACO AND INTRAOCULAR LENS PLACEMENT (IOC);  Surgeon: Estill Cotta, MD;  Location:  [facility];  Service: Ophthalmology;  Laterality: Right;  US01:16.5 AP%23.9 CDE31.92 LOT [id number]  . CHOLECYSTECTOMY    . COLONOSCOPY    . EYE SURGERY    . FOOT SURGERY Left    metal in foot  . HAMMER TOE SURGERY Bilateral   . HERNIA REPAIR N/A 2015   20 surgical screws holding mesh in abdomen after umbilical hernia  . HERNIA REPAIR    . JOINT REPLACEMENT Right 2009   knee  . KIDNEY STONE SURGERY    . KNEE ARTHROPLASTY Left 06/15/2015   Procedure: COMPUTER ASSISTED TOTAL KNEE ARTHROPLASTY;  Surgeon: Dereck Leep, MD;  Location: [facility];  Service: Orthopedics;  Laterality: Left;  . KNEE ARTHROSCOPY Right   . KNEE ARTHROSCOPY Left 07/16/2016   Procedure: ARTHROSCOPY KNEE;  Surgeon: Dereck Leep, MD;  Location: [facility];  Service: Orthopedics;  Laterality: Left;  . KNEE ARTHROTOMY Left 12/12/2016   Procedure: KNEE ARTHROTOMY AND OSTECTOMY, POSSIBLE POLY EXCHANGE;  Surgeon: Dereck Leep, MD;  Location: [facility];  Service: Orthopedics;  Laterality: Left;  . MOHS SURGERY Left    hand  . OSTECTOMY  07/16/2016   Procedure: OSTECTOMY;  Surgeon: Dereck Leep, MD;  Location: [facility];  Service: Orthopedics;;  . TONSILLECTOMY     as a child    Home Medications:  Allergies as of 12/01/2018   No Known Allergies  Medication List       Accurate as of December 01, 2018 11:07 AM. If you have any questions, ask your nurse or doctor.        STOP taking these medications   sildenafil 20 MG tablet Commonly known as: REVATIO Stopped by: Abbie Sons, MD     TAKE these medications   aspirin EC 81 MG tablet Take 81 mg by mouth at bedtime.   fluticasone 50 MCG/ACT nasal spray Commonly known as: FLONASE Place into the nose.   ibuprofen 800 MG tablet Commonly known as: ADVIL Take by mouth.   pravastatin 20 MG tablet Commonly known as: PRAVACHOL Take 20 mg by mouth at bedtime.   sertraline 50 MG tablet Commonly known as: ZOLOFT Take by mouth. What changed: Another  medication with the same name was removed. Continue taking this medication, and follow the directions you see here. Changed by: Abbie Sons, MD       Allergies: No Known Allergies  Family History: Family History  Problem Relation Age of Onset  . Kidney cancer Mother   . Heart disease Father     Social History:  reports that he has never smoked. He has never used smokeless tobacco. He reports that he does not drink alcohol or use drugs.  ROS: UROLOGY Frequent Urination?: No Hard to postpone urination?: No Burning/pain with urination?: No Get up at night to urinate?: No Leakage of urine?: No Urine stream starts and stops?: Yes Trouble starting stream?: No Do you have to strain to urinate?: Yes Blood in urine?: No Urinary tract infection?: No Sexually transmitted disease?: No Injury to kidneys or bladder?: No Painful intercourse?: No Weak stream?: No Erection problems?: No Penile pain?: No  Gastrointestinal Nausea?: No Vomiting?: No Indigestion/heartburn?: No Diarrhea?: No Constipation?: No  Constitutional Fever: No Night sweats?: No Weight loss?: No Fatigue?: No  Skin Skin rash/lesions?: No Itching?: No  Eyes Blurred vision?: No Double vision?: No  Ears/Nose/Throat Sore throat?: No Sinus problems?: No  Hematologic/Lymphatic Swollen glands?: No Easy bruising?: No  Cardiovascular Leg swelling?: No Chest pain?: No  Respiratory Cough?: No Shortness of breath?: No  Endocrine Excessive thirst?: No  Musculoskeletal Back pain?: No Joint pain?: No  Neurological Headaches?: No Dizziness?: No  Psychologic Depression?: No Anxiety?: Yes  Physical Exam: BP 129/80 (BP Location: Left Arm, Patient Position: Sitting, Cuff Size: Normal)   Pulse 84   Ht 6' (1.829 m)   Wt 244 lb (110.7 kg)   BMI 33.09 kg/m   Constitutional:  Alert and oriented, No acute distress. HEENT: [place] AT, moist mucus membranes.  Trachea midline, no masses.  Cardiovascular: No clubbing, cyanosis, or edema. Respiratory: Normal respiratory effort, no increased work of breathing. GI: Abdomen is soft, nontender, nondistended, no abdominal masses GU: No CVA tenderness Lymph: No cervical or inguinal lymphadenopathy. Skin: No rashes, bruises or suspicious lesions. Neurologic: Grossly intact, no focal deficits, moving all 4 extremities. Psychiatric: Normal mood and affect.   Assessment & Plan:    - Bilateral nonobstructing nephrolithiasis Asymptomatic and have recommended surveillance. I did recommend obtaining a KUB which will be easier than ultrasound to follow the calculi.  He will have it performed within the next few weeks.  He will be notified with results.  Follow-up in 1 year with a repeat KUB.   Abbie Sons, [place] [address] [place], [place] [address] [phone]

## 2018-12-02 ENCOUNTER — Ambulatory Visit
Admission: RE | Admit: 2018-12-02 | Discharge: 2018-12-02 | Disposition: A | Discharge status: Home / Self Care | Payer: Medicare HMO | Source: Ambulatory Visit | Attending: Urology | Admitting: Urology

## 2018-12-02 ENCOUNTER — Other Ambulatory Visit: Payer: Self-pay

## 2018-12-02 DIAGNOSIS — N200 Calculus of kidney: Secondary | ICD-10-CM

## 2018-12-08 ENCOUNTER — Telehealth: Payer: Self-pay

## 2018-12-08 NOTE — Telephone Encounter (Signed)
Patient notified

## 2018-12-08 NOTE — Telephone Encounter (Signed)
-----   Message from Abbie Sons, MD sent at 12/07/2018  5:46 PM EDT ----- Right renal calculus seen on KUB.  I do not see any left-sided stones.  Follow-up as scheduled.

## 2019-12-03 ENCOUNTER — Ambulatory Visit: Payer: Medicare HMO | Admitting: Urology

## 2021-03-20 ENCOUNTER — Other Ambulatory Visit: Payer: Self-pay | Admitting: Surgery

## 2021-03-20 DIAGNOSIS — R1900 Intra-abdominal and pelvic swelling, mass and lump, unspecified site: Secondary | ICD-10-CM

## 2021-03-30 ENCOUNTER — Other Ambulatory Visit: Payer: Self-pay

## 2021-03-30 ENCOUNTER — Ambulatory Visit
Admission: RE | Admit: 2021-03-30 | Discharge: 2021-03-30 | Disposition: A | Discharge status: Home / Self Care | Payer: Medicare Other | Source: Ambulatory Visit | Attending: Surgery | Admitting: Surgery

## 2021-03-30 DIAGNOSIS — R1900 Intra-abdominal and pelvic swelling, mass and lump, unspecified site: Secondary | ICD-10-CM | POA: Diagnosis present

## 2021-06-18 ENCOUNTER — Emergency Department: Payer: Medicare Other

## 2021-06-18 ENCOUNTER — Emergency Department
Admission: EM | Admit: 2021-06-18 | Discharge: 2021-06-18 | Disposition: A | Discharge status: Home / Self Care | Payer: Medicare Other | Attending: Student in an Organized Health Care Education/Training Program | Admitting: Student in an Organized Health Care Education/Training Program

## 2021-06-18 DIAGNOSIS — S2242XA Multiple fractures of ribs, left side, initial encounter for closed fracture: Secondary | ICD-10-CM | POA: Diagnosis not present

## 2021-06-18 DIAGNOSIS — W050XXA Fall from non-moving wheelchair, initial encounter: Secondary | ICD-10-CM | POA: Diagnosis not present

## 2021-06-18 DIAGNOSIS — S299XXA Unspecified injury of thorax, initial encounter: Secondary | ICD-10-CM | POA: Diagnosis present

## 2021-06-18 MED ORDER — LIDOCAINE 5 % EX PTCH
1.0000 | MEDICATED_PATCH | CUTANEOUS | Status: DC
  Administered 2021-06-18: 1 via TRANSDERMAL
  Filled 2021-06-18: qty 1

## 2021-06-18 MED ORDER — LIDOCAINE 5 % EX PTCH: 1.0000 | MEDICATED_PATCH | Freq: Two times a day (BID) | CUTANEOUS | 0 refills | Status: AC

## 2021-06-18 NOTE — ED Triage Notes (Signed)
Pt had mechanical fall 2 days ago and landed on his left rib cage. Reports pain with breathing.  ?

## 2021-06-18 NOTE — ED Provider Notes (Signed)
? ?[facility] ?Provider Note ? ? ? Event Date/Time  ? First MD Initiated Contact with Patient 06/18/21 1117   ?  (approximate) ? ? ?History  ? ?Rib Injury ? ? ?HPI ? ?Travis Mills. is a 76 y.o. male gentleman who takes aspirin daily presents to the ER for evaluation of left anterior chest wall pain that occurred after he had mechanical fall while moving lawn chairs.  States he lost his balance falling landing on the ground and felt the left side of his chest compressed with popping sensation and moderate to severe pain.  Has been trying to manage at home but having worsening pain.  Does have some discomfort with deep inspiration.  Denies any epigastric pain or abdominal pain nausea or vomiting. ?  ? ? ?Physical Exam  ? ?Triage Vital Signs: ?ED Triage Vitals  ?Enc Vitals Group  ?   BP 06/18/21 0952 (!) 141/88  ?   Pulse Rate 06/18/21 0952 85  ?   Resp 06/18/21 0952 18  ?   Temp 06/18/21 0952 98.6 ?F (37 ?C)  ?   Temp Source 06/18/21 0952 Oral  ?   SpO2 06/18/21 0952 94 %  ?   Weight 06/18/21 0951 235 lb (106.6 kg)  ?   Height --   ?   Head Circumference --   ?   Peak Flow --   ?   Pain Score 06/18/21 0951 7  ?   Pain Loc --   ?   Pain Edu? --   ?   Excl. in [place]? --   ? ? ?Most recent vital signs: ?Vitals:  ? 06/18/21 0952 06/18/21 1130  ?BP: (!) 141/88 (!) 141/88  ?Pulse: 85 66  ?Resp: 18 18  ?Temp: 98.6 ?F (37 ?C)   ?SpO2: 94% 95%  ? ? ? ?Constitutional: Alert  ?Eyes: Conjunctivae are normal.  ?Head: Atraumatic. ?Nose: No congestion/rhinnorhea. ?Mouth/Throat: Mucous membranes are moist.   ?Neck: Painless ROM.  ?Cardiovascular:   Good peripheral circulation. ?Respiratory: Normal respiratory effort.  No retractions.  ?Gastrointestinal: Soft and nontender to deep palpation in all 4 quadrants. ?Musculoskeletal:  no deformity, there is palpation of the left anterior chest wall no crepitus no overlying ecchymosis. ?Neurologic:  MAE spontaneously. No gross focal neurologic deficits are  appreciated.  ?Skin:  Skin is warm, dry and intact. No rash noted. ?Psychiatric: Mood and affect are normal. Speech and behavior are normal. ? ? ? ?ED Results / Procedures / Treatments  ? ?Labs ?(all labs ordered are listed, but only abnormal results are displayed) ?Labs Reviewed - No data to display ? ? ?EKG ? ?ED ECG REPORT ?I, Merlyn Lot, the attending physician, personally viewed and interpreted this ECG. ? ? Date: 06/18/2021 ? EKG Time: 9:54 ? Rate: 80 ? Rhythm: sinus ? Axis: normal ? Intervals:rbbb ? ST&T Change: no stemi, no depression ? ? ? ?RADIOLOGY ?Please see ED Course for my review and interpretation. ? ?I personally reviewed all radiographic images ordered to evaluate for the above acute complaints and reviewed radiology reports and findings.  These findings were personally discussed with the patient.  Please see medical record for radiology report. ? ? ? ?PROCEDURES: ? ?Critical Care performed: No ? ?Procedures ? ? ?MEDICATIONS ORDERED IN ED: ?Medications  ?lidocaine (LIDODERM) 5 % 1 patch (1 patch Transdermal Patch Applied 06/18/21 1141)  ? ? ? ?IMPRESSION / MDM / ASSESSMENT AND PLAN / ED COURSE  ?I reviewed the triage vital signs and  the nursing notes. ?             ?               ? ?Differential diagnosis includes, but is not limited to, fracture, contusion, pleurisy, musculoskeletal strain, intra-abdominal injury, pneumonia pneumothorax ? ?Patient presenting with symptoms as described above.  Imaging was ordered to evaluate for the above differential.  He is nontoxic-appearing abdominal soft and benign. ? ?Clinical Course as of 06/18/21 1230  ?Sun Jun 18, 2021  ?1210 CT imaging on my review and interpretation does not show any evidence of pneumothorax will await formal radiology report. [PR]  ?97 Imaging is consistent with fractures no other evidence of acute traumatic injury.  Patient feels significant improvement after Lidoderm.  Reassured by results.  Does appear stable and appropriate  for outpatient follow-up.  We discussed strict return precautions. [PR]  ?  ?Clinical Course User Index ?[PR] Merlyn Lot, MD  ? ? ? ?FINAL CLINICAL IMPRESSION(S) / ED DIAGNOSES  ? ?Final diagnoses:  ?Closed fracture of multiple ribs of left side, initial encounter  ? ? ? ?Rx / DC Orders  ? ?ED Discharge Orders   ? ?      Ordered  ?  lidocaine (LIDODERM) 5 %  Every 12 hours       ? 06/18/21 1229  ? ?  ?  ? ?  ? ? ? ?Note:  This document was prepared using Dragon voice recognition software and may include unintentional dictation errors. ? ?  ?Merlyn Lot, MD ?06/18/21 1230 ? ?

## 2021-06-18 NOTE — ED Notes (Signed)
EDP at bedside with pt.  

## 2021-06-18 NOTE — ED Notes (Signed)
Pt to ED for sharp L rib pain with deep inspiration, worse with laying down, worse with moving from moving to sitting (if he has to use chest muscles to help move self). ?Pt has unlabored breathing, lungs sound clear but diminished on LLL. 95% on RA. ?

## 2021-08-28 ENCOUNTER — Other Ambulatory Visit: Payer: Self-pay

## 2021-08-28 ENCOUNTER — Ambulatory Visit
Admission: RE | Admit: 2021-08-28 | Discharge: 2021-08-28 | Disposition: A | Discharge status: Home / Self Care | Payer: Medicare Other | Source: Ambulatory Visit | Attending: Urology | Admitting: Urology

## 2021-08-28 ENCOUNTER — Ambulatory Visit
Admission: RE | Admit: 2021-08-28 | Discharge: 2021-08-28 | Disposition: A | Discharge status: Home / Self Care | Payer: Medicare Other | Attending: Urology | Admitting: Urology

## 2021-08-28 ENCOUNTER — Ambulatory Visit: Payer: Medicare Other | Admitting: Urology

## 2021-08-28 ENCOUNTER — Encounter: Payer: Self-pay | Admitting: Urology

## 2021-08-28 ENCOUNTER — Other Ambulatory Visit
Admission: RE | Admit: 2021-08-28 | Discharge: 2021-08-28 | Disposition: A | Discharge status: Home / Self Care | Payer: Medicare Other | Source: Home / Self Care | Attending: Urology | Admitting: Urology

## 2021-08-28 VITALS — BP 128/86 | HR 79 | Ht 70.0 in | Wt 237.0 lb

## 2021-08-28 DIAGNOSIS — N201 Calculus of ureter: Secondary | ICD-10-CM

## 2021-08-28 DIAGNOSIS — N200 Calculus of kidney: Secondary | ICD-10-CM | POA: Diagnosis not present

## 2021-08-28 DIAGNOSIS — R109 Unspecified abdominal pain: Secondary | ICD-10-CM

## 2021-08-28 DIAGNOSIS — R3129 Other microscopic hematuria: Secondary | ICD-10-CM

## 2021-08-28 LAB — URINALYSIS, COMPLETE (UACMP) WITH MICROSCOPIC
Bilirubin Urine: NEGATIVE
Glucose, UA: NEGATIVE mg/dL
Ketones, ur: NEGATIVE mg/dL
Leukocytes,Ua: NEGATIVE
Nitrite: NEGATIVE
Protein, ur: NEGATIVE mg/dL
Specific Gravity, Urine: 1.02 (ref 1.005–1.030)
pH: 5.5 (ref 5.0–8.0)

## 2021-08-28 MED ORDER — HYDROCODONE-ACETAMINOPHEN 5-325 MG PO TABS: 1.0000 | ORAL_TABLET | Freq: Four times a day (QID) | ORAL | 0 refills | Status: DC | PRN

## 2021-08-28 MED ORDER — TAMSULOSIN HCL 0.4 MG PO CAPS: 0.4000 mg | ORAL_CAPSULE | Freq: Every day | ORAL | 0 refills | Status: DC

## 2021-08-28 NOTE — Progress Notes (Signed)
08/28/21 2:40 PM   Travis Mills. 03-04-1945 [id number]  Referring provider:  Idelle Crouch, MD [facility] [facility] [place],  [place] [address]   Chief Complaint  Patient presents with   Nephrolithiasis    Urological history: Nephrolithiasis  - S/p  left ureteroscopic stone removal by Dr. Erlene Quan in 2016.   -  Ultrasound performed 10/23/2018 showed bilateral nonobstructing lower pole calculi.  - CT abdomen and pelvis on 03/30/2021 visualized Simple appearing cyst lower pole right kidney. There are bilateral nonobstructing renal calculi. A single calculus in the lower pole right kidney measures 8 mm. There are 2 distinct calculi in the mid left kidney, largest measuring 8 mm. No obstructive uropathy within either kidney. Bladder is minimally distended without gross abnormality. The adrenals are normal.   HPI: Travis Gilliam. is a 76 y.o.male who presents today for further evaluation of kidney stone symptom with UA and prior KUB.   KUB today was personally reviewed and interpreted and visualizes a 4 mm stone on the left at the L- 3 vertebrae also the left lower calculi stones are visible.   He reports that his pain on his left side is still ongoing Patient denies any modifying or aggravating factors.  Patient denies any gross hematuria, dysuria.  Patient denies any fevers, chills, nausea or vomiting.   UA few bacteria, but he had recently completed a course of Omnicef for a sore throat.     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    History of kidney stones    HOH (hard of hearing)    AIDS   Hyperlipidemia    Irritable bowel syndrome (IBS)    welchol has improved bowel problems   Knee pain    Left   PONV (postoperative nausea and vomiting)    Sleep apnea    CPAP    Surgical History: Past Surgical History:  Procedure Laterality Date   CATARACT EXTRACTION W/PHACO Left 04/04/2016   Procedure: CATARACT EXTRACTION PHACO AND INTRAOCULAR LENS PLACEMENT ([place]);  Surgeon: Estill Cotta, MD;  Location: [facility] ORS;  Service: Ophthalmology;  Laterality: Left;  [place]  01:25 AP% 21.3 CDE 30.15 Fluid pack lot # [id number]   CATARACT EXTRACTION W/PHACO Right 05/09/2016   Procedure: CATARACT EXTRACTION PHACO AND INTRAOCULAR LENS PLACEMENT ([facility]);  Surgeon: Estill Cotta, MD;  Location: [facility] ORS;  Service: Ophthalmology;  Laterality: Right;  US01:16.5 AP%23.9 CDE31.92 LOT #[id number]   CHOLECYSTECTOMY     COLONOSCOPY     EYE SURGERY     FOOT SURGERY Left    metal in foot   HAMMER TOE SURGERY Bilateral    HERNIA REPAIR N/A 2015   20 surgical screws holding mesh in abdomen after umbilical hernia   HERNIA REPAIR     JOINT REPLACEMENT Right 2009   knee   KIDNEY STONE SURGERY     KNEE ARTHROPLASTY Left 06/15/2015   Procedure: COMPUTER ASSISTED TOTAL KNEE ARTHROPLASTY;  Surgeon: Dereck Leep, MD;  Location: [facility] ORS;  Service: Orthopedics;  Laterality: Left;   KNEE ARTHROSCOPY Right    KNEE ARTHROSCOPY Left 07/16/2016   Procedure: ARTHROSCOPY KNEE;  Surgeon: Dereck Leep, MD;  Location: [facility] ORS;  Service: Orthopedics;  Laterality: Left;   KNEE ARTHROTOMY Left 12/12/2016   Procedure: KNEE ARTHROTOMY AND OSTECTOMY, POSSIBLE POLY EXCHANGE;  Surgeon: Dereck Leep, MD;  Location: [facility] ORS;  Service: Orthopedics;  Laterality: Left;   MOHS SURGERY Left    hand   OSTECTOMY  07/16/2016   Procedure: OSTECTOMY;  Surgeon: Dereck Leep, MD;  Location: [facility] ORS;  Service: Orthopedics;;   TONSILLECTOMY     as a child    Home  Medications:  Allergies as of 08/28/2021       Reactions   Lithium Other (See Comments)   Other reaction(s): Bradycardia Heart block        Medication List        Accurate as of August 28, 2021  2:40 PM. If you have any questions, ask your nurse or doctor.          amoxicillin 500 MG tablet Commonly known as: AMOXIL Take by mouth.   aspirin EC 81 MG tablet Take 81 mg by mouth at bedtime.   benazepril 20 MG tablet Commonly known as: LOTENSIN Take 20 mg by mouth daily.   colesevelam 625 MG tablet Commonly known as: WELCHOL Take 1,875 mg by mouth 2 (two) times daily.   fluticasone 50 MCG/ACT nasal spray Commonly known as: FLONASE Place into the nose.   HYDROcodone-acetaminophen 5-325 MG tablet Commonly known as: NORCO/VICODIN Take 1 tablet by mouth every 6 (six) hours as needed for moderate pain. Started by: Zara Council, PA-C   ibuprofen 800 MG tablet Commonly known as: ADVIL Take by mouth.   lidocaine 5 % Commonly known as: Lidoderm Place 1 patch onto the skin every 12 (twelve) hours. Remove & Discard patch within 12 hours or as directed by MD   Na Sulfate-K Sulfate-Mg Sulf 17.5-3.13-1.6 GM/177ML Soln Take by mouth.   pravastatin 20 MG tablet Commonly known as: PRAVACHOL Take 20 mg by mouth at bedtime.   sertraline 100 MG tablet Commonly known as: ZOLOFT Take 100 mg by mouth daily. What changed: Another medication with the same name was removed. Continue taking this medication, and follow the directions you see here. Changed by: Zara Council, PA-C   tamsulosin 0.4 MG Caps capsule Commonly known as: FLOMAX Take 1 capsule (0.4 mg total) by mouth daily. Started by: Zara Council, PA-C        Allergies:  Allergies  Allergen Reactions   Lithium Other (See Comments)    Other reaction(s): Bradycardia Heart block     Family History: Family History  Problem Relation Age of Onset   Kidney cancer Mother    Heart disease Father      Social History:  reports that he has never smoked. He has never used smokeless tobacco. He reports that he does not drink alcohol and does not use drugs.   Physical Exam: BP 128/86   Pulse 79   Ht 5' 10"  (1.778 m)   Wt 237 lb (107.5 kg)   BMI 34.01 kg/m   Constitutional:  Alert and oriented, No acute distress. HEENT: [place] AT, moist mucus membranes.  Trachea midline Cardiovascular: No clubbing, cyanosis, or edema. Respiratory: Normal respiratory effort, no increased work of breathing. Neurologic: Grossly intact, no focal deficits, moving all 4 extremities. Psychiatric: Normal  mood and affect.  Laboratory Data: PSA (Prostate Specific Antigen), Total 0.10 - 4.00 ng/mL 1.23   Resulting Agency  [facility] - LAB  Narrative Performed by [facility] - LAB Test results were determined with Beckman Coulter Hybritech Assay. Values obtained with different assay methods cannot be used interchangeably in serial testing. Assay results should not be interpreted as absolute evidence of the presence or absence of malignant disease  Specimen Collected: 06/16/21 08:27 Last Resulted: 06/16/21 11:53  Received From: [facility]  Result Received: 06/18/21 09:49   WBC (White Blood Cell Count) 4.1 - 10.2 10^3/uL 6.2   RBC (Red Blood Cell Count) 4.69 - 6.13 10^6/uL 4.52 Low    Hemoglobin 14.1 - 18.1 gm/dL 14.9   Hematocrit 40.0 - 52.0 % 44.8   MCV (Mean Corpuscular Volume) 80.0 - 100.0 fl 99.1   MCH (Mean Corpuscular Hemoglobin) 27.0 - 31.2 pg 33.0 High    MCHC (Mean Corpuscular Hemoglobin Concentration) 32.0 - 36.0 gm/dL 33.3   Platelet Count 150 - 450 10^3/uL 208   RDW-CV (Red Cell Distribution Width) 11.6 - 14.8 % 12.8   MPV (Mean Platelet Volume) 9.4 - 12.4 fl 9.0 Low    Neutrophils 1.50 - 7.80 10^3/uL 4.05   Lymphocytes 1.00 - 3.60 10^3/uL 1.16   Monocytes 0.00 - 1.50 10^3/uL 0.60   Eosinophils 0.00 - 0.55 10^3/uL 0.26   Basophils 0.00 - 0.09 10^3/uL 0.07    Neutrophil % 32.0 - 70.0 % 65.8   Lymphocyte % 10.0 - 50.0 % 18.9   Monocyte % 4.0 - 13.0 % 9.8   Eosinophil % 1.0 - 5.0 % 4.2   Basophil% 0.0 - 2.0 % 1.1   Immature Granulocyte % <=0.7 % 0.2   Immature Granulocyte Count <=0.06 10^3/L 0.01   Resulting Agency  [facility] - LAB   Specimen Collected: 06/16/21 08:27 Last Resulted: 06/16/21 09:10  Received From: [facility]  Result Received: 06/18/21 09:49   Thyroid Stimulating Hormone (TSH) 0.450-5.330 uIU/ml uIU/mL 2.904   Resulting Agency  [facility] - LAB   Specimen Collected: 06/16/21 08:27 Last Resulted: 06/16/21 11:53  Received From: [facility]  Result Received: 06/18/21 09:49   Hemoglobin A1C 4.2 - 5.6 % 5.5   Average Blood Glucose (Calc) mg/dL 111   Resulting [facility] - LAB  Narrative Performed by [facility] - LAB Normal Range:    4.2 - 5.6%  Increased Risk:  5.7 - 6.4%  Diabetes:        >= 6.5%  Glycemic Control for adults with diabetes:  <7%    Specimen Collected: 06/16/21 08:27 Last Resulted: 06/16/21 10:32  Received From: [facility]  Result Received: 06/18/21 09:49   Cholesterol, Total 100 - 200 mg/dL 160   Triglyceride 35 - 199 mg/dL 131   HDL (High Density Lipoprotein) Cholesterol 29.0 - 71.0 mg/dL 45.2   LDL Calculated 0 - 130 mg/dL 89   VLDL Cholesterol mg/dL 26   Cholesterol/HDL Ratio  3.5   Resulting Agency  [facility] - LAB   Specimen Collected: 06/16/21 08:27 Last Resulted: 06/16/21 11:54  Received From: [facility]  Result Received: 06/18/21 09:49   Glucose 70 - 110 mg/dL 114 High    Sodium 136 - 145 mmol/L 140   Potassium 3.6 - 5.1 mmol/L 4.5   Chloride 97 - 109 mmol/L 106   Carbon Dioxide (CO2) 22.0 - 32.0  mmol/L 27.3   Urea Nitrogen (BUN) 7 - 25 mg/dL 21   Creatinine 0.7 - 1.3 mg/dL 0.8   Glomerular Filtration Rate (eGFR), MDRD Estimate >60 mL/min/1.73sq m 95    Calcium 8.7 - 10.3 mg/dL 9.3   AST  8 - 39 U/L 14   ALT  6 - 57 U/L 11   Alk Phos (alkaline Phosphatase) 34 - 104 U/L 50   Albumin 3.5 - 4.8 g/dL 4.4   Bilirubin, Total 0.3 - 1.2 mg/dL 1.0   Protein, Total 6.1 - 7.9 g/dL 6.7   A/G Ratio 1.0 - 5.0 gm/dL 1.9   Resulting Agency  [facility] - LAB   Specimen Collected: 06/16/21 08:27 Last Resulted: 06/16/21 11:52  Received From: [facility]  Result Received: 06/18/21 09:49   Urinalysis Results for orders placed or performed during the hospital encounter of 08/28/21  Urinalysis, Complete w Microscopic  Result Value Ref Range   Color, Urine YELLOW YELLOW   APPearance CLEAR CLEAR   Specific Gravity, Urine 1.020 1.005 - 1.030   pH 5.5 5.0 - 8.0   Glucose, UA NEGATIVE NEGATIVE mg/dL   Hgb urine dipstick SMALL (A) NEGATIVE   Bilirubin Urine NEGATIVE NEGATIVE   Ketones, ur NEGATIVE NEGATIVE mg/dL   Protein, ur NEGATIVE NEGATIVE mg/dL   Nitrite NEGATIVE NEGATIVE   Leukocytes,Ua NEGATIVE NEGATIVE   Squamous Epithelial / LPF 0-5 0 - 5   WBC, UA 0-5 0 - 5 WBC/hpf   RBC / HPF 0-5 0 - 5 RBC/hpf   Bacteria, UA FEW (A) NONE SEEN   Mucus PRESENT    Cellular Cast, UA PRESENT   I have reviewed the labs.    Pertinent Imaging: KUB was personally reviewed and interpreted see HPI. Radiologist interpretation pending.   Assessment & Plan:   1. Left ureteral stone -KUB show ~ 4 mm left proximal ureteral stone - UA shows small Hgb and few bacteria  - Based on size he is likely to have interval passage of this stone. He is agreeable to see if he is able to pass this on his own. Will have him return in 2 weeks to reassess with KUB - Flomax 0.4 mg prescribed  - Vicodin prescribed for pain management.  - reviewed return precautions   2. Nephrolithiasis -KUB with bilateral nephrolithiasis as well -continue to follow clinically at this time  Return in about 2 weeks (around 09/11/2021) for KUB and office visit  .   [facility] [address] [place], [place] [address] [phone]  I, Kirke Shaggy Littlejohn,acting as a scribe for [facility], PA-C.,have documented all relevant documentation on the behalf of Travis Borgerding, PA-C,as directed by  [facility], PA-C while in the presence of [place], PA-C.  I have reviewed the above documentation for accuracy and completeness, and I agree with the above.    Zara Council, PA-C

## 2021-08-29 ENCOUNTER — Telehealth: Payer: Self-pay

## 2021-08-29 LAB — URINE CULTURE: Culture: NO GROWTH

## 2021-08-29 NOTE — Telephone Encounter (Signed)
-----   Message from Nori Riis, PA-C sent at 08/29/2021  4:25 PM EDT ----- Please let Mr. Vandenberghe know that his urine culture was negative for infection

## 2021-08-29 NOTE — Telephone Encounter (Signed)
Notified pt as advised, pt expressed understanding.  ?

## 2021-09-08 ENCOUNTER — Other Ambulatory Visit: Payer: Self-pay

## 2021-09-08 DIAGNOSIS — N200 Calculus of kidney: Secondary | ICD-10-CM

## 2021-09-11 ENCOUNTER — Ambulatory Visit
Admission: RE | Admit: 2021-09-11 | Discharge: 2021-09-11 | Disposition: A | Discharge status: Home / Self Care | Payer: Medicare Other | Attending: Urology | Admitting: Urology

## 2021-09-11 ENCOUNTER — Ambulatory Visit: Payer: Medicare Other | Admitting: Urology

## 2021-09-11 ENCOUNTER — Encounter: Payer: Self-pay | Admitting: Urology

## 2021-09-11 ENCOUNTER — Ambulatory Visit
Admission: RE | Admit: 2021-09-11 | Discharge: 2021-09-11 | Disposition: A | Discharge status: Home / Self Care | Payer: Medicare Other | Source: Ambulatory Visit | Attending: Urology | Admitting: Urology

## 2021-09-11 VITALS — BP 137/78 | HR 84 | Ht 70.0 in | Wt 237.0 lb

## 2021-09-11 DIAGNOSIS — N201 Calculus of ureter: Secondary | ICD-10-CM | POA: Diagnosis not present

## 2021-09-11 DIAGNOSIS — N200 Calculus of kidney: Secondary | ICD-10-CM

## 2021-09-11 MED ORDER — TAMSULOSIN HCL 0.4 MG PO CAPS: 0.4000 mg | ORAL_CAPSULE | Freq: Every day | ORAL | 0 refills | Status: AC

## 2021-09-11 NOTE — Progress Notes (Addendum)
09/11/21 11:21 AM   Travis Mills January 14, 1946 [id number]  Referring provider:  Idelle Crouch, MD [facility] [place],  [place] [address]  Chief Complaint  Patient presents with   Nephrolithiasis    Urological history  Nephrolithiasis  - S/p  left ureteroscopic stone removal by Dr. Erlene Quan in 2016.   -  Ultrasound performed 10/23/2018 showed bilateral nonobstructing lower pole calculi.  - CT abdomen and pelvis on 03/30/2021 visualized Simple appearing cyst lower pole right kidney. There are bilateral nonobstructing renal calculi. A single calculus in the lower pole right kidney measures 8 mm. There are 2 distinct calculi in the mid left kidney, largest measuring 8 mm. No obstructive uropathy within either kidney. Bladder is minimally distended without gross abnormality. The adrenals are normal. - KUB 08/28/2021 visualized 5 mm stone is identified in the right kidney. 4 mm calcification projected in the medial left abdomen, a stone within the proximal left ureter is not excluded.     HPI: Travis Mills. is a 76 y.o.male who returns for a 2 week follow-up with KUB.   KUB was personally reviewed and interpreted today. It visualize migration of proximal ureter stone from L3 to over the transverse process of L4.  He has had limited pain.  He has been taking the tamsulosin.  Patient denies any modifying or aggravating factors. Patient denies any gross hematuria, dysuria. Patient denies any fevers, chills, nausea or vomiting.   PMH: Past Medical History:  Diagnosis Date   Acute gallstone pancreatitis 2009   Anxiety    Arthritis    Cancer ([place])    Basal Cell Skin Cancer   Chronic cough    Complication of anesthesia    heart block during surgery around 2014 poss r/t lithium   Cough    CHRONIC   Depression    Environmental and seasonal allergies    GERD (gastroesophageal reflux disease)    occasionally   Heart block    complete heart block  in 2014, thought to be due to lithium   History of kidney stones    HOH (hard of hearing)    AIDS   Hyperlipidemia    Irritable bowel syndrome (IBS)    welchol has improved bowel problems   Knee pain    Left   PONV (postoperative nausea and vomiting)    Sleep apnea    CPAP    Surgical History: Past Surgical History:  Procedure Laterality Date   CATARACT EXTRACTION W/PHACO Left 04/04/2016   Procedure: CATARACT EXTRACTION PHACO AND INTRAOCULAR LENS PLACEMENT ([place]);  Surgeon: Estill Cotta, MD;  Location: [facility] ORS;  Service: Ophthalmology;  Laterality: Left;  [place]  01:25 AP% 21.3 CDE 30.15 Fluid pack lot # [id number]   CATARACT EXTRACTION W/PHACO Right 05/09/2016   Procedure: CATARACT EXTRACTION PHACO AND INTRAOCULAR LENS PLACEMENT ([facility]);  Surgeon: Estill Cotta, MD;  Location: [facility] ORS;  Service: Ophthalmology;  Laterality: Right;  US01:16.5 AP%23.9 CDE31.92 LOT #[id number]   CHOLECYSTECTOMY     COLONOSCOPY     EYE SURGERY     FOOT SURGERY Left    metal in foot   HAMMER TOE SURGERY Bilateral    HERNIA REPAIR N/A 2015   20 surgical screws holding mesh in abdomen after umbilical hernia   HERNIA REPAIR     JOINT REPLACEMENT Right 2009   knee   KIDNEY STONE SURGERY     KNEE ARTHROPLASTY Left 06/15/2015   Procedure: COMPUTER ASSISTED TOTAL KNEE ARTHROPLASTY;  Surgeon: Dereck Leep, MD;  Location: [facility] OR[facility];  Service: Orthopedics;  Laterality: Left;   KNEE ARTHROSCOPY Right    KNEE ARTHROSCOPY Left 07/16/2016   Procedure: ARTHROSCOPY KNEE;  Surgeon: Dereck Leep, MD;  Location: [facility];  Service: Orthopedics;  Laterality: Left;   KNEE ARTHROTOMY Left 12/12/2016   Procedure: KNEE ARTHROTOMY AND OSTECTOMY, POSSIBLE POLY EXCHANGE;  Surgeon: Dereck Leep, MD;  Location: [facility];  Service: Orthopedics;  Laterality: Left;   MOHS SURGERY Left    hand   OSTECTOMY  07/16/2016   Procedure: OSTECTOMY;  Surgeon: Dereck Leep, MD;  Location: [facility] OR[facility];  Service: Orthopedics;;    TONSILLECTOMY     as a child    Home Medications:  Allergies as of 09/11/2021       Reactions   Lithium Other (See Comments)   Other reaction(s): Bradycardia Heart block        Medication List        Accurate as of September 11, 2021 11:21 AM. If you have any questions, ask your nurse or doctor.          STOP taking these medications    amoxicillin 500 MG tablet Commonly known as: AMOXIL Stopped by: Arwilda [place], PA-C   ibuprofen 800 MG tablet Commonly known as: ADVIL Stopped by: Juanluis Guastella, PA-C   Na Sulfate-K Sulfate-Mg Sulf 17.5-3.13-1.6 GM/177ML Soln Stopped by: Eilene Voigt, PA-C       TAKE these medications    aspirin EC 81 MG tablet Take 81 mg by mouth at bedtime.   benazepril 20 MG tablet Commonly known as: LOTENSIN Take 20 mg by mouth daily.   colesevelam 625 MG tablet Commonly known as: WELCHOL Take 1,875 mg by mouth as needed.   fluticasone 50 MCG/ACT nasal spray Commonly known as: FLONASE Place into the nose as needed.   HYDROcodone-acetaminophen 5-325 MG tablet Commonly known as: NORCO/VICODIN Take 1 tablet by mouth every 6 (six) hours as needed for moderate pain.   lidocaine 5 % Commonly known as: Lidoderm Place 1 patch onto the skin every 12 (twelve) hours. Remove & Discard patch within 12 hours or as directed by MD What changed:  when to take this reasons to take this   pravastatin 20 MG tablet Commonly known as: PRAVACHOL Take 20 mg by mouth at bedtime.   sertraline 100 MG tablet Commonly known as: ZOLOFT Take 100 mg by mouth daily.   tamsulosin 0.4 MG Caps capsule Commonly known as: FLOMAX Take 1 capsule (0.4 mg total) by mouth daily.        Allergies:  Allergies  Allergen Reactions   Lithium Other (See Comments)    Other reaction(s): Bradycardia Heart block     Family History: Family History  Problem Relation Age of Onset   Kidney cancer Mother    Heart disease Father     Social History:   reports that he has never smoked. He has never used smokeless tobacco. He reports that he does not drink alcohol and does not use drugs.   Physical Exam: BP 137/78 (BP Location: Left Arm, Patient Position: Sitting, Cuff Size: Large)   Pulse 84   Ht '5\' 10"'$  (1.778 m)   Wt 237 lb (107.5 kg)   BMI 34.01 kg/m   Constitutional:  Alert and oriented, No acute distress. HEENT:  AT, moist mucus membranes.  Trachea midline Cardiovascular: No clubbing, cyanosis, or edema. Respiratory: Normal respiratory effort, no increased work of breathing. Neurologic: Grossly intact, no focal  deficits, moving all 4 extremities. Psychiatric: Normal mood and affect.  Laboratory Data:. N/A  Pertinent Imaging: KUB personally reviewed and interpreted. See HPI.  Radiologist interpretation pending.   Assessment & Plan:    Nephrolithiasis  - KUB reviewed today shows migration of the left ureteral stone - We briefly reviewed treatment options such as ESWL verus ureteroscopy versus waiting for spontaneous passage. He would like to continue to wait. Will have him follow-up in about 2 weeks for KUB - tamsulosin refilled - strainer given - reviewed red flag signs and instructed to contact office or seek treatment in the ED if he should experience them  Return in about 2 weeks (around 09/25/2021) for KUB and office visit .   [facility] [address], [place], [place] [address] [phone]  I, Kirke Shaggy Littlejohn,acting as a scribe for [facility], PA-C.,have documented all relevant documentation on the behalf of W[place] Boscher[place], PA-C,as directed by  [facility], PA-C while in the presence of [place], PA-C.  I have reviewed the above documentation for accuracy and completeness, and I agree with the above.    Zara Council, PA-C

## 2021-09-25 ENCOUNTER — Ambulatory Visit
Admission: RE | Admit: 2021-09-25 | Discharge: 2021-09-25 | Disposition: A | Discharge status: Home / Self Care | Payer: Medicare Other | Source: Ambulatory Visit | Attending: Urology | Admitting: Urology

## 2021-09-25 ENCOUNTER — Ambulatory Visit
Admission: RE | Admit: 2021-09-25 | Discharge: 2021-09-25 | Disposition: A | Discharge status: Home / Self Care | Payer: Medicare Other | Attending: Urology | Admitting: Urology

## 2021-09-25 ENCOUNTER — Other Ambulatory Visit: Payer: Self-pay

## 2021-09-25 ENCOUNTER — Encounter: Payer: Self-pay | Admitting: Urology

## 2021-09-25 ENCOUNTER — Ambulatory Visit: Payer: Medicare Other | Admitting: Urology

## 2021-09-25 VITALS — BP 101/66 | HR 111 | Ht 70.0 in | Wt 238.0 lb

## 2021-09-25 DIAGNOSIS — Z87442 Personal history of urinary calculi: Secondary | ICD-10-CM

## 2021-09-25 DIAGNOSIS — N200 Calculus of kidney: Secondary | ICD-10-CM

## 2021-09-25 NOTE — Progress Notes (Signed)
09/25/21 10:51 PM   Travis Mills. 02/28/1945 [id number]  Referring provider:  Idelle Crouch, MD [facility] [place],  [place] [address]  Urological history  Nephrolithiasis  - S/p  left ureteroscopic stone removal by Dr. Erlene Quan in 2016.   -  Ultrasound performed 10/23/2018 showed bilateral nonobstructing lower pole calculi.  - CT abdomen and pelvis on 03/30/2021 visualized Simple appearing cyst lower pole right kidney. There are bilateral nonobstructing renal calculi. A single calculus in the lower pole right kidney measures 8 mm. There are 2 distinct calculi in the mid left kidney, largest measuring 8 mm. No obstructive uropathy within either kidney. Bladder is minimally distended without gross abnormality. The adrenals are normal. - KUB 08/28/2021 visualized 5 mm stone is identified in the right kidney. 4 mm calcification projected in the medial left abdomen, a stone within the proximal left ureter is not excluded. - KUB 09/11/2021 showed stable unchanged stone over the right kidney. The previously questioned stone in the proximal left ureter is noted further distally between the L4-5 level.  2. Family history of RCC - mother with metastatic RCC - lethal   Chief Complaint  Patient presents with   Nephrolithiasis    2wk w/KUB      HPI: Travis Mills. is a 76 y.o.male who returns for a 2 week follow-up with KUB with his wife, Travis Mills.   He had spontaneous passage of a stone. He brought the 5 mm stone today.  He is having some slight dysuria that is clearing.  Patient denies any modifying or aggravating factors.  Patient denies any gross hematuria, dysuria or suprapubic/flank pain.  Patient denies any fevers, chills, nausea or vomiting.    Today's KUB shows the 8 mm right lower pole stone, two stones in the mid left kidney with the largest being 8 mm in size.  The distal 5 mm left ureteral stone is no longer visible.     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   History of kidney stones    HOH (hard of hearing)    AIDS   Hyperlipidemia    Irritable bowel syndrome (IBS)    welchol has improved bowel problems   Knee pain    Left   PONV (postoperative nausea and vomiting)    Sleep apnea    CPAP    Surgical History: Past Surgical History:  Procedure Laterality Date   CATARACT EXTRACTION W/PHACO Left 04/04/2016   Procedure: CATARACT EXTRACTION PHACO AND INTRAOCULAR LENS PLACEMENT ([place]);  Surgeon: Estill Cotta, MD;  Location: [facility] ORS;  Service: Ophthalmology;  Laterality: Left;  [place]  01:25 AP% 21.3 CDE 30.15 Fluid pack lot # [id number]   CATARACT EXTRACTION W/PHACO Right 05/09/2016   Procedure: CATARACT EXTRACTION PHACO AND INTRAOCULAR LENS PLACEMENT ([facility]);  Surgeon: Estill Cotta, MD;  Location: [facility] ORS;  Service: Ophthalmology;  Laterality: Right;  US01:16.5 AP%23.9 CDE31.92 LOT #[id number]   CHOLECYSTECTOMY  COLONOSCOPY     EYE SURGERY     FOOT SURGERY Left    metal in foot   HAMMER TOE SURGERY Bilateral    HERNIA REPAIR N/A 2015   20 surgical screws holding mesh in abdomen after umbilical hernia   HERNIA REPAIR     JOINT REPLACEMENT Right 2009   knee   KIDNEY STONE SURGERY     KNEE ARTHROPLASTY Left 06/15/2015   Procedure: COMPUTER ASSISTED TOTAL KNEE ARTHROPLASTY;  Surgeon: Dereck Leep, MD;  Location: [facility] ORS;  Service: Orthopedics;  Laterality: Left;   KNEE ARTHROSCOPY Right    KNEE ARTHROSCOPY Left 07/16/2016   Procedure: ARTHROSCOPY KNEE;  Surgeon: Dereck Leep, MD;   Location: [facility] ORS;  Service: Orthopedics;  Laterality: Left;   KNEE ARTHROTOMY Left 12/12/2016   Procedure: KNEE ARTHROTOMY AND OSTECTOMY, POSSIBLE POLY EXCHANGE;  Surgeon: Dereck Leep, MD;  Location: [facility] ORS;  Service: Orthopedics;  Laterality: Left;   MOHS SURGERY Left    hand   OSTECTOMY  07/16/2016   Procedure: OSTECTOMY;  Surgeon: Dereck Leep, MD;  Location: [facility] ORS;  Service: Orthopedics;;   TONSILLECTOMY     as a child    Home Medications:  Allergies as of 09/25/2021       Reactions   Lithium Other (See Comments)   Other reaction(s): Bradycardia Heart block        Medication List        Accurate as of September 25, 2021 10:51 PM. If you have any questions, ask your nurse or doctor.          STOP taking these medications    HYDROcodone-acetaminophen 5-325 MG tablet Commonly known as: NORCO/VICODIN Stopped by: Zara Council, PA-C       TAKE these medications    aspirin EC 81 MG tablet Take 81 mg by mouth at bedtime.   benazepril 20 MG tablet Commonly known as: LOTENSIN Take 20 mg by mouth daily.   colesevelam 625 MG tablet Commonly known as: WELCHOL Take 1,875 mg by mouth as needed.   fluticasone 50 MCG/ACT nasal spray Commonly known as: FLONASE Place into the nose as needed.   lidocaine 5 % Commonly known as: Lidoderm Place 1 patch onto the skin every 12 (twelve) hours. Remove & Discard patch within 12 hours or as directed by MD What changed:  when to take this reasons to take this   pravastatin 20 MG tablet Commonly known as: PRAVACHOL Take 20 mg by mouth at bedtime.   sertraline 100 MG tablet Commonly known as: ZOLOFT Take 100 mg by mouth daily.   tamsulosin 0.4 MG Caps capsule Commonly known as: FLOMAX Take 1 capsule (0.4 mg total) by mouth daily.        Allergies:  Allergies  Allergen Reactions   Lithium Other (See Comments)    Other reaction(s): Bradycardia Heart block     Family History: Family History   Problem Relation Age of Onset   Kidney cancer Mother    Heart disease Father     Social History:  reports that he has never smoked. He has never used smokeless tobacco. He reports that he does not drink alcohol and does not use drugs.   Physical Exam: BP 101/66   Pulse (!) 111   Ht '5\' 10"'$  (1.778 m)   Wt 238 lb (108 kg)   BMI 34.15 kg/m   Constitutional:  Alert and oriented, No acute distress. HEENT: [place] AT, moist mucus membranes.  Trachea midline  Cardiovascular: No clubbing, cyanosis, or edema. Respiratory: Normal respiratory effort, no increased work of breathing. Neurologic: Grossly intact, no focal deficits, moving all 4 extremities. Psychiatric: Normal mood and affect.  Laboratory Data: N/A  Pertinent imaging KUB today 8 mm right lower pole stone, two stones in the mid left kidney with the largest being 8 mm in size.  The distal 5 mm left ureteral stone is no longer visible.   I have independently reviewed the films.  See HPI.  Radiologist interpretation still pending.    Assessment & Plan:    Nephrolithiasis  - Spontaneous passage  - Will send for analysis and contact for results  - We discussed general stone prevention techniques including drinking plenty water with goal of producing 2.5 L urine daily, increased citric acid intake, avoidance of high oxalate containing foods, and decreased salt intake.  Information about dietary recommendations given today.    Return if symptoms worsen or fail to improve.  [place] [address] [place], [place] [address] [phone]  I,Kailey Littlejohn,acting as a scribe for [facility], PA-C.,have documented all relevant documentation on the behalf of Cleveland Paiz, PA-C,as directed by  [facility], PA-C while in the presence of [place], PA-C.  I have reviewed the above documentation for accuracy and completeness, and I agree with the above.    Zara Council, PA-C

## 2021-10-02 LAB — CALCULI, WITH PHOTOGRAPH (CLINICAL LAB)
Calcium Oxalate Dihydrate: 40 %
Calcium Oxalate Monohydrate: 60 %
Weight Calculi: 53 mg

## 2021-10-04 ENCOUNTER — Telehealth: Payer: Self-pay

## 2021-10-04 NOTE — Telephone Encounter (Signed)
-----   Message from Nori Riis, PA-C sent at 10/03/2021  8:10 AM EDT ----- Please let Travis Mills know that his stone analysis revealed that the stone was mad of calcium oxalate which is the most common time of stones people form.  Generally speaking to prevent these types of stones, you need to increase water intake (1 gallon a day), try to avoid meats, reduce salt in your diet and increase citrus acid by drinking juices like OJ, lemonade, etc.  You should also reduce the amount of oxalates in your diet as well.  We will mail you a list of foods and their oxalate content to see if you need to make any adjustments in your diet.

## 2021-10-04 NOTE — Telephone Encounter (Signed)
Notified pt as advised, pt expressed understanding.  ?

## 2021-10-20 ENCOUNTER — Encounter: Payer: Self-pay | Admitting: Gastroenterology

## 2021-10-23 ENCOUNTER — Encounter
Admission: RE | Disposition: A | Discharge status: Home / Self Care | Payer: Self-pay | Source: Ambulatory Visit | Attending: Gastroenterology

## 2021-10-23 ENCOUNTER — Ambulatory Visit: Payer: Medicare Other | Admitting: Anesthesiology

## 2021-10-23 ENCOUNTER — Ambulatory Visit
Admission: RE | Admit: 2021-10-23 | Discharge: 2021-10-23 | Disposition: A | Discharge status: Home / Self Care | Payer: Medicare Other | Source: Ambulatory Visit | Attending: Gastroenterology | Admitting: Gastroenterology

## 2021-10-23 ENCOUNTER — Other Ambulatory Visit: Payer: Self-pay

## 2021-10-23 ENCOUNTER — Encounter: Payer: Self-pay | Admitting: Gastroenterology

## 2021-10-23 DIAGNOSIS — K589 Irritable bowel syndrome without diarrhea: Secondary | ICD-10-CM | POA: Diagnosis not present

## 2021-10-23 DIAGNOSIS — K5730 Diverticulosis of large intestine without perforation or abscess without bleeding: Secondary | ICD-10-CM | POA: Insufficient documentation

## 2021-10-23 DIAGNOSIS — E669 Obesity, unspecified: Secondary | ICD-10-CM | POA: Diagnosis not present

## 2021-10-23 DIAGNOSIS — K219 Gastro-esophageal reflux disease without esophagitis: Secondary | ICD-10-CM | POA: Insufficient documentation

## 2021-10-23 DIAGNOSIS — I10 Essential (primary) hypertension: Secondary | ICD-10-CM | POA: Diagnosis not present

## 2021-10-23 DIAGNOSIS — Z6832 Body mass index (BMI) 32.0-32.9, adult: Secondary | ICD-10-CM | POA: Diagnosis not present

## 2021-10-23 DIAGNOSIS — Z1211 Encounter for screening for malignant neoplasm of colon: Secondary | ICD-10-CM | POA: Insufficient documentation

## 2021-10-23 DIAGNOSIS — K640 First degree hemorrhoids: Secondary | ICD-10-CM | POA: Insufficient documentation

## 2021-10-23 HISTORY — PX: COLONOSCOPY: SHX5424

## 2021-10-23 SURGERY — COLONOSCOPY
Anesthesia: General

## 2021-10-23 MED ORDER — PROPOFOL 10 MG/ML IV BOLUS
INTRAVENOUS | Status: DC | PRN
  Administered 2021-10-23: 70 mg via INTRAVENOUS
  Administered 2021-10-23: 10 mg via INTRAVENOUS

## 2021-10-23 MED ORDER — PROPOFOL 500 MG/50ML IV EMUL
INTRAVENOUS | Status: DC | PRN
  Administered 2021-10-23: 140 ug/kg/min via INTRAVENOUS

## 2021-10-23 MED ORDER — GLYCOPYRROLATE 0.2 MG/ML IJ SOLN
INTRAMUSCULAR | Status: DC | PRN
  Administered 2021-10-23: .2 mg via INTRAVENOUS

## 2021-10-23 MED ORDER — LIDOCAINE HCL (CARDIAC) PF 100 MG/5ML IV SOSY
PREFILLED_SYRINGE | INTRAVENOUS | Status: DC | PRN
Start: 2021-10-23 — End: 2021-10-23
  Administered 2021-10-23: 60 mg via INTRAVENOUS

## 2021-10-23 MED ORDER — SODIUM CHLORIDE 0.9 % IV SOLN: INTRAVENOUS | Status: DC

## 2021-10-23 NOTE — Op Note (Signed)
[facility]astroenterology Patient Name: Travis Mills Procedure Date: 10/23/2021 1:20 PM MRN: [id number] Account #: [id number] Date of Birth: 06-Jan-1946 Admit Type: Outpatient Age: 76 Room: [facility] ENDO ROOM 2 Gender: Male Note Status: [place] Instrument Name: Colonoscope [id number] Procedure:             Colonoscopy Indications:           Screening for colorectal malignant neoplasm Providers:             Rueben Bash, DO Referring MD:          Leonie Douglas. Doy Hutching, MD (Referring MD) Medicines:             Monitored Anesthesia Care Complications:         No immediate complications. Estimated blood loss: None. Procedure:             Pre-Anesthesia Assessment:                        - Prior to the procedure, a History and Physical was                         performed, and patient medications and allergies were                         reviewed. The patient is competent. The risks and                         benefits of the procedure and the sedation options and                         risks were discussed with the patient. All questions                         were answered and informed consent was obtained.                         Patient identification and proposed procedure were                         verified by the physician, the nurse, the anesthetist                         and the technician in the endoscopy suite. Mental                         Status Examination: alert and oriented. Airway                         Examination: normal oropharyngeal airway and neck                         mobility. Respiratory Examination: clear to                         auscultation. CV Examination: RRR, no murmurs, no S3                         or S4. Prophylactic Antibiotics: The patient does not  require prophylactic antibiotics. Prior                         Anticoagulants: The patient has taken no previous                          anticoagulant or antiplatelet agents. ASA Grade                         Assessment: II - A patient with mild systemic disease.                         After reviewing the risks and benefits, the patient                         was deemed in satisfactory condition to undergo the                         procedure. The anesthesia plan was to use monitored                         anesthesia care (MAC). Immediately prior to                         administration of medications, the patient was                         re-assessed for adequacy to receive sedatives. The                         heart rate, respiratory rate, oxygen saturations,                         blood pressure, adequacy of pulmonary ventilation, and                         response to care were monitored throughout the                         procedure. The physical status of the patient was                         re-assessed after the procedure.                        After obtaining informed consent, the colonoscope was                         passed under direct vision. Throughout the procedure,                         the patient's blood pressure, pulse, and oxygen                         saturations were monitored continuously. The                         Colonoscope was introduced through the anus and  advanced to the the cecum, identified by appendiceal                         orifice and ileocecal valve. The colonoscopy was                         performed without difficulty. The patient tolerated                         the procedure well. The quality of the bowel                         preparation was evaluated using the BBPS [facility] Bowel                         Preparation Scale) with scores of: Right Colon = 1                         (portion of mucosa seen, but other areas not well seen                         due to staining, residual stool and/or opaque liquid),                          Transverse Colon = 1 (portion of mucosa seen, but                         other areas not well seen due to staining, residual                         stool and/or opaque liquid) and Left Colon = 2 (minor                         amount of residual staining, small fragments of stool                         and/or opaque liquid, but mucosa seen well). The total                         BBPS score equals 4. The quality of the bowel                         preparation was inadequate. The ileocecal valve,                         appendiceal orifice, and rectum were photographed. Findings:      The perianal and digital rectal examinations were normal. Pertinent       negatives include normal sphincter tone.      Extensive amounts of semi-solid stool was found in the entire colon,       precluding visualization. Estimated blood loss: none.      Non-bleeding internal hemorrhoids were found during retroflexion. The       hemorrhoids were Grade I (internal hemorrhoids that do not prolapse).       Estimated blood loss: none.      Multiple small-mouthed diverticula were found in the left colon.  Estimated blood loss: none.      Adequate exam precluded by poor prep. No appreciable large mass or       lesion of what could be visualized. Impression:            - Preparation of the colon was inadequate.                        - Stool in the entire examined colon.                        - Non-bleeding internal hemorrhoids.                        - Diverticulosis in the left colon.                        - No specimens collected. Recommendation:        - Patient has a contact number available for                         emergencies. The signs and symptoms of potential                         delayed complications were discussed with the patient.                         Return to normal activities tomorrow. Written                         discharge instructions were provided to the patient.                         - Discharge patient to home.                        - Resume previous diet.                        - Continue present medications.                        - Repeat colonoscopy 6-12 months because the bowel                         preparation was poor.                        - Return to GI office as previously scheduled.                        - The findings and recommendations were discussed with                         the patient. Procedure Code(s):     --- Professional ---                        [phone], Colonoscopy, flexible; diagnostic, including                         collection of specimen(s) by brushing or washing,  when                         performed (separate procedure) Diagnosis Code(s):     --- Professional ---                        Z12.11, Encounter for screening for malignant neoplasm                         of colon                        K64.0, First degree hemorrhoids                        K57.30, Diverticulosis of large intestine without                         perforation or abscess without bleeding CPT copyright 2019 [employer]. All rights reserved. The codes documented in this report are preliminary and upon coder review may  be revised to meet current compliance requirements. Attending Participation:      I personally performed the entire procedure. Volney American, DO [name] DO, DO 10/23/2021 1:51:10 PM This report has been signed electronically. Number of Addenda: 0 Note Initiated On: 10/23/2021 1:20 PM Scope Withdrawal Time: 0 hours 8 minutes 39 seconds  Total Procedure Duration: 0 hours 13 minutes 39 seconds  Estimated Blood Loss:  Estimated blood loss: none.      [facility]

## 2021-10-23 NOTE — Anesthesia Procedure Notes (Signed)
Procedure Name: General with mask airway Date/Time: 10/23/2021 1:30 PM  Performed by: Kelton Pillar, CRNAPre-anesthesia Checklist: Patient identified, Emergency Drugs available, Suction available and Patient being monitored Patient Re-evaluated:Patient Re-evaluated prior to induction Oxygen Delivery Method: Simple face mask Induction Type: IV induction Placement Confirmation: positive ETCO2, CO2 detector and breath sounds checked- equal and bilateral Dental Injury: Teeth and Oropharynx as per pre-operative assessment

## 2021-10-23 NOTE — H&P (Signed)
Pre-Procedure H&P   Patient ID: Travis Mills. is a 76 y.o. male.  Gastroenterology Provider: [name], DO  Referring Provider: Octavia Bruckner, PA PCP: Idelle Crouch, MD  Date: 10/23/2021  HPI Mr. Travis Mills. is a 76 y.o. male who presents today for Colonoscopy for screening.  Last colonoscopy 2013- only demonstrating avm in cecum and sigmoid diverticulosis. No fhx crc/polyps. Pt w/o any gi complaints.   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   History of kidney stones    HOH (hard of hearing)    AIDS   Hyperlipidemia    Irritable bowel syndrome (IBS)    welchol has improved bowel problems   Knee pain    Left   PONV (postoperative nausea and vomiting)    Sleep apnea    CPAP    Past Surgical History:  Procedure Laterality Date   CATARACT EXTRACTION W/PHACO Left 04/04/2016   Procedure: CATARACT EXTRACTION PHACO AND INTRAOCULAR LENS PLACEMENT ([place]);  Surgeon: Estill Cotta, MD;  Location: [facility];  Service: Ophthalmology;  Laterality: Left;  [place]  01:25 AP% 21.3 CDE 30.15 Fluid pack lot # [id number]   CATARACT EXTRACTION W/PHACO Right 05/09/2016   Procedure: CATARACT EXTRACTION PHACO AND INTRAOCULAR LENS PLACEMENT ([facility]);  Surgeon: Estill Cotta, MD;  Location: [facility];  Service: Ophthalmology;  Laterality: Right;  US01:16.5 AP%23.9 CDE31.92 LOT #[id number]   CHOLECYSTECTOMY     COLONOSCOPY     EYE SURGERY     FOOT SURGERY Left    metal in foot   HAMMER TOE SURGERY Bilateral    HERNIA REPAIR N/A 2015   20 surgical  screws holding mesh in abdomen after umbilical hernia   HERNIA REPAIR     JOINT REPLACEMENT Right 2009   knee   KIDNEY STONE SURGERY     KNEE ARTHROPLASTY Left 06/15/2015   Procedure: COMPUTER ASSISTED TOTAL KNEE ARTHROPLASTY;  Surgeon: Dereck Leep, MD;  Location: [facility];  Service: Orthopedics;  Laterality: Left;   KNEE ARTHROSCOPY Right    KNEE ARTHROSCOPY Left 07/16/2016   Procedure: ARTHROSCOPY KNEE;  Surgeon: Dereck Leep, MD;  Location: [facility];  Service: Orthopedics;  Laterality: Left;   KNEE ARTHROTOMY Left 12/12/2016   Procedure: KNEE ARTHROTOMY AND OSTECTOMY, POSSIBLE POLY EXCHANGE;  Surgeon: Dereck Leep, MD;  Location: [facility];  Service: Orthopedics;  Laterality: Left;   MOHS SURGERY Left    hand   OSTECTOMY  07/16/2016   Procedure: OSTECTOMY;  Surgeon: Dereck Leep, MD;  Location: [facility];  Service: Orthopedics;;   TONSILLECTOMY     as a child    Family History No h/o GI disease or malignancy  Review of Systems  Constitutional:  Negative for activity change, appetite change, chills, diaphoresis, fatigue, fever and unexpected weight change.  HENT:  Negative for trouble swallowing and voice change.   Respiratory:  Negative for shortness of breath and wheezing.   Cardiovascular:  Negative  for chest pain, palpitations and leg swelling.  Gastrointestinal:  Negative for abdominal distention, abdominal pain, anal bleeding, blood in stool, constipation, diarrhea, nausea and vomiting.  Musculoskeletal:  Negative for arthralgias and myalgias.  Skin:  Negative for color change and pallor.  Neurological:  Negative for dizziness, syncope and weakness.  Psychiatric/Behavioral:  Negative for confusion. The patient is not nervous/anxious.   All other systems reviewed and are negative.    Medications No current facility-administered medications on file prior to encounter.   Current Outpatient Medications on File Prior to Encounter  Medication Sig Dispense Refill    aspirin EC 81 MG tablet Take 81 mg by mouth at bedtime.     fluticasone (FLONASE) 50 MCG/ACT nasal spray Place into the nose as needed.     lidocaine (LIDODERM) 5 % Place 1 patch onto the skin every 12 (twelve) hours. Remove & Discard patch within 12 hours or as directed by MD (Patient taking differently: Place 1 patch onto the skin as needed. Remove & Discard patch within 12 hours or as directed by MD) 20 patch 0   pravastatin (PRAVACHOL) 20 MG tablet Take 20 mg by mouth at bedtime.       Pertinent medications related to GI and procedure were reviewed by me with the patient prior to the procedure   Current Facility-Administered Medications:    0.9 %  sodium chloride infusion, , Intravenous, Continuous, [name], DO, Last Rate: 20 mL/hr at 10/23/21 1204, Continued from Pre-op at 10/23/21 1204  sodium chloride 20 mL/hr at 10/23/21 1204       Allergies  Allergen Reactions   Lithium Other (See Comments)    Other reaction(s): Bradycardia Heart block    Allergies were reviewed by me prior to the procedure  Objective   Body mass index is 32.36 kg/m. Vitals:   10/23/21 1138  BP: 129/85  Pulse: 71  Resp: 18  Temp: (!) 96.7 F (35.9 C)  TempSrc: Temporal  SpO2: 97%  Weight: 105.2 kg  Height: '5\' 11"'$  (1.803 m)     Physical Exam Vitals and nursing note reviewed.  Constitutional:      General: He is not in acute distress.    Appearance: Normal appearance. He is obese. He is not ill-appearing, toxic-appearing or diaphoretic.  HENT:     Head: Normocephalic and atraumatic.     Nose: Nose normal.     Mouth/Throat:     Mouth: Mucous membranes are moist.     Pharynx: Oropharynx is clear.  Eyes:     General: No scleral icterus.    Extraocular Movements: Extraocular movements intact.  Cardiovascular:     Rate and Rhythm: Normal rate and regular rhythm.     Heart sounds: Normal heart sounds. No murmur heard.    No friction rub. No gallop.  Pulmonary:     Effort:  Pulmonary effort is normal. No respiratory distress.     Breath sounds: Normal breath sounds. No wheezing, rhonchi or rales.  Abdominal:     General: Bowel sounds are normal. There is no distension.     Palpations: Abdomen is soft.     Tenderness: There is no abdominal tenderness. There is no guarding or rebound.  Musculoskeletal:     Cervical back: Neck supple.     Right lower leg: No edema.     Left lower leg: No edema.  Skin:    General: Skin is warm and dry.     Coloration: Skin is not jaundiced or pale.  Neurological:     General: No focal deficit present.     Mental Status: He is alert and oriented to person, place, and time. Mental status is at baseline.  Psychiatric:        Mood and Affect: Mood normal.        Behavior: Behavior normal.        Thought Content: Thought content normal.        Judgment: Judgment normal.      Assessment:  Mr. Travis Mills. is a 76 y.o. male  who presents today for Colonoscopy for screening.  Plan:  Colonoscopy with possible intervention today  Colonoscopy with possible biopsy, control of bleeding, polypectomy, and interventions as necessary has been discussed with the patient/patient representative. Informed consent was obtained from the patient/patient representative after explaining the indication, nature, and risks of the procedure including but not limited to death, bleeding, perforation, missed neoplasm/lesions, cardiorespiratory compromise, and reaction to medications. Opportunity for questions was given and appropriate answers were provided. Patient/patient representative has verbalized understanding is amenable to undergoing the procedure.   [name], DO  [facility]  Portions of the record may have been created with voice recognition software. Occasional wrong-word or 'sound-a-like' substitutions may have occurred due to the inherent limitations of voice recognition software.  Read the chart  carefully and recognize, using context, where substitutions may have occurred.

## 2021-10-23 NOTE — Transfer of Care (Signed)
Immediate Anesthesia Transfer of Care Note  Patient: Travis Mills.  Procedure(s) Performed: COLONOSCOPY  Patient Location: Endoscopy Unit  Anesthesia Type:General  Level of Consciousness: drowsy and patient cooperative  Airway & Oxygen Therapy: Patient Spontanous Breathing and Patient connected to face mask oxygen  Post-op Assessment: Report given to RN and Post -op Vital signs reviewed and stable  Post vital signs: Reviewed and stable  Last Vitals:  Vitals Value Taken Time  BP 105/74 10/23/21 1350  Temp    Pulse 83 10/23/21 1350  Resp 16 10/23/21 1350  SpO2 99 % 10/23/21 1350    Last Pain:  Vitals:   10/23/21 1350  TempSrc:   PainSc: Asleep         Complications: No notable events documented.

## 2021-10-23 NOTE — Interval H&P Note (Signed)
History and Physical Interval Note: Preprocedure H&P from 10/23/21  was reviewed and there was no interval change after seeing and examining the patient.  Written consent was obtained from the patient after discussion of risks, benefits, and alternatives. Patient has consented to proceed with Colonoscopy with possible intervention   10/23/2021 1:12 PM  Travis Mills.  has presented today for surgery, with the diagnosis of Colon cancer screening (Z12.11).  The various methods of treatment have been discussed with the patient and family. After consideration of risks, benefits and other options for treatment, the patient has consented to  Procedure(s): COLONOSCOPY (N/A) as a surgical intervention.  The patient's history has been reviewed, patient examined, no change in status, stable for surgery.  I have reviewed the patient's chart and labs.  Questions were answered to the patient's satisfaction.     [name]

## 2021-10-23 NOTE — Anesthesia Postprocedure Evaluation (Signed)
Anesthesia Post Note  Patient: Travis Mills.  Procedure(s) Performed: COLONOSCOPY  Patient location during evaluation: Endoscopy Anesthesia Type: General Level of consciousness: awake and alert Pain management: pain level controlled Vital Signs Assessment: post-procedure vital signs reviewed and stable Respiratory status: spontaneous breathing, nonlabored ventilation and respiratory function stable Cardiovascular status: blood pressure returned to baseline and stable Postop Assessment: no apparent nausea or vomiting Anesthetic complications: no   No notable events documented.   Last Vitals:  Vitals:   10/23/21 1400 10/23/21 1410  BP: (!) 107/59 113/74  Pulse: 62 (!) 58  Resp: 19 14  Temp:    SpO2: 99% 99%    Last Pain:  Vitals:   10/23/21 1410  TempSrc:   PainSc: 0-No pain                 [place] Ouch

## 2021-10-23 NOTE — Anesthesia Preprocedure Evaluation (Addendum)
Anesthesia Evaluation  Patient identified by MRN, date of birth, ID band Patient awake    Reviewed: Allergy & Precautions, NPO status , Patient's Chart, lab work & pertinent test results  History of Anesthesia Complications (+) PONV and history of anesthetic complications (heart block during surgery around 2014 poss r/t lithium)  Airway Mallampati: II  TM Distance: >3 FB Neck ROM: full    Dental  (+) Chipped   Pulmonary sleep apnea and Continuous Positive Airway Pressure Ventilation ,    Pulmonary exam normal        Cardiovascular METS: 3 - Mets hypertension, + DOE  Normal cardiovascular exam     Neuro/Psych PSYCHIATRIC DISORDERS Anxiety negative neurological ROS     GI/Hepatic negative GI ROS, Neg liver ROS,   Endo/Other  negative endocrine ROS  Renal/GU negative Renal ROS  negative genitourinary   Musculoskeletal  (+) Arthritis ,   Abdominal (+) + obese,   Peds  Hematology negative hematology ROS (+)   Anesthesia Other Findings Past Medical History: 2009: Acute gallstone pancreatitis No date: Anxiety No date: Arthritis No date: Cancer [facility])     Comment:  Basal Cell Skin Cancer No date: Chronic cough No date: Complication of anesthesia     Comment:  heart block during surgery around 2014 poss r/t lithium No date: Cough     Comment:  CHRONIC No date: Depression No date: Environmental and seasonal allergies No date: GERD (gastroesophageal reflux disease)     Comment:  occasionally No date: Heart block     Comment:  complete heart block in 2014, thought to be due to               lithium No date: History of kidney stones No date: HOH (hard of hearing)     Comment:  AIDS No date: Hyperlipidemia No date: Irritable bowel syndrome (IBS)     Comment:  welchol has improved bowel problems No date: Knee pain     Comment:  Left No date: PONV (postoperative nausea and vomiting) No date: Sleep apnea      Comment:  CPAP  Past Surgical History: 04/04/2016: CATARACT EXTRACTION W/PHACO; Left     Comment:  Procedure: CATARACT EXTRACTION PHACO AND INTRAOCULAR               LENS PLACEMENT ([facility]);  Surgeon: Estill Cotta, MD;                Location: [facility];  Service: Ophthalmology;  Laterality:              Left;  [place]  01:25 AP% 21.3 CDE 30.15 Fluid pack lot #               [id number] 05/09/2016: CATARACT EXTRACTION W/PHACO; Right     Comment:  Procedure: CATARACT EXTRACTION PHACO AND INTRAOCULAR               LENS PLACEMENT ([facility]);  Surgeon: Estill Cotta, MD;                Location: [facility];  Service: Ophthalmology;  Laterality:              Right;  US01:16.5 AP%23.9 CDE31.92 LOT #[id number] H No date: CHOLECYSTECTOMY No date: COLONOSCOPY No date: EYE SURGERY No date: FOOT SURGERY; Left     Comment:  metal in foot No date: HAMMER TOE SURGERY; Bilateral 2015: HERNIA REPAIR; N/A     Comment:  20 surgical screws holding mesh in abdomen after  umbilical hernia No date: HERNIA REPAIR 2009: JOINT REPLACEMENT; Right     Comment:  knee No date: KIDNEY STONE SURGERY 06/15/2015: KNEE ARTHROPLASTY; Left     Comment:  Procedure: COMPUTER ASSISTED TOTAL KNEE ARTHROPLASTY;                Surgeon: Dereck Leep, MD;  Location: [facility];                Service: Orthopedics;  Laterality: Left; No date: KNEE ARTHROSCOPY; Right 07/16/2016: KNEE ARTHROSCOPY; Left     Comment:  Procedure: ARTHROSCOPY KNEE;  Surgeon: Dereck Leep,               MD;  Location: [facility] OR[facility];  Service: Orthopedics;                Laterality: Left; 12/12/2016: KNEE ARTHROTOMY; Left     Comment:  Procedure: KNEE ARTHROTOMY AND OSTECTOMY, POSSIBLE POLY               EXCHANGE;  Surgeon: Dereck Leep, MD;  Location: [facility]               ORS;  Service: Orthopedics;  Laterality: Left; No date: MOHS SURGERY; Left     Comment:  hand 07/16/2016: OSTECTOMY     Comment:  Procedure: OSTECTOMY;  Surgeon: Dereck Leep, MD;                Location: [facility] OR[facility];  Service: Orthopedics;; No date: TONSILLECTOMY     Comment:  as a child     Reproductive/Obstetrics negative OB ROS                            Anesthesia Physical Anesthesia Plan  ASA: 2  Anesthesia Plan: General   Post-op Pain Management: Minimal or no pain anticipated   Induction: Intravenous  PONV Risk Score and Plan: Propofol infusion and TIVA  Airway Management Planned: Natural Airway  Additional Equipment:   Intra-op Plan:   Post-operative Plan:   Informed Consent: I have reviewed the patients History and Physical, chart, labs and discussed the procedure including the risks, benefits and alternatives for the proposed anesthesia with the patient or authorized representative who has indicated his/her understanding and acceptance.     Dental Advisory Given  Plan Discussed with: Anesthesiologist, CRNA and Surgeon  Anesthesia Plan Comments:        Anesthesia Quick Evaluation

## 2021-10-24 ENCOUNTER — Encounter: Payer: Self-pay | Admitting: Gastroenterology

## 2021-12-26 ENCOUNTER — Other Ambulatory Visit: Payer: Self-pay | Admitting: Internal Medicine

## 2021-12-26 DIAGNOSIS — R270 Ataxia, unspecified: Secondary | ICD-10-CM

## 2021-12-26 DIAGNOSIS — G44029 Chronic cluster headache, not intractable: Secondary | ICD-10-CM

## 2022-01-09 ENCOUNTER — Ambulatory Visit
Admission: RE | Admit: 2022-01-09 | Discharge: 2022-01-09 | Disposition: A | Discharge status: Home / Self Care | Payer: Medicare Other | Source: Ambulatory Visit | Attending: Internal Medicine | Admitting: Internal Medicine

## 2022-01-09 DIAGNOSIS — G44029 Chronic cluster headache, not intractable: Secondary | ICD-10-CM | POA: Insufficient documentation

## 2022-01-09 DIAGNOSIS — R270 Ataxia, unspecified: Secondary | ICD-10-CM | POA: Diagnosis present

## 2022-07-01 IMAGING — CR DG RIBS W/ CHEST 3+V*L*
1 series · 5 of 5 positions shown · non-contrast
Comparison: Chest x-ray January 01, 2011

CLINICAL DATA: Fall 2 days ago. Landed on left side. Pain with
breathing.

EXAM:
LEFT RIBS AND CHEST - 3+ VIEW

[Series 1: dg ribs unilateral w/chest left · 0.14mm/px · 5 of 5 slices shown]
[im 1/5]
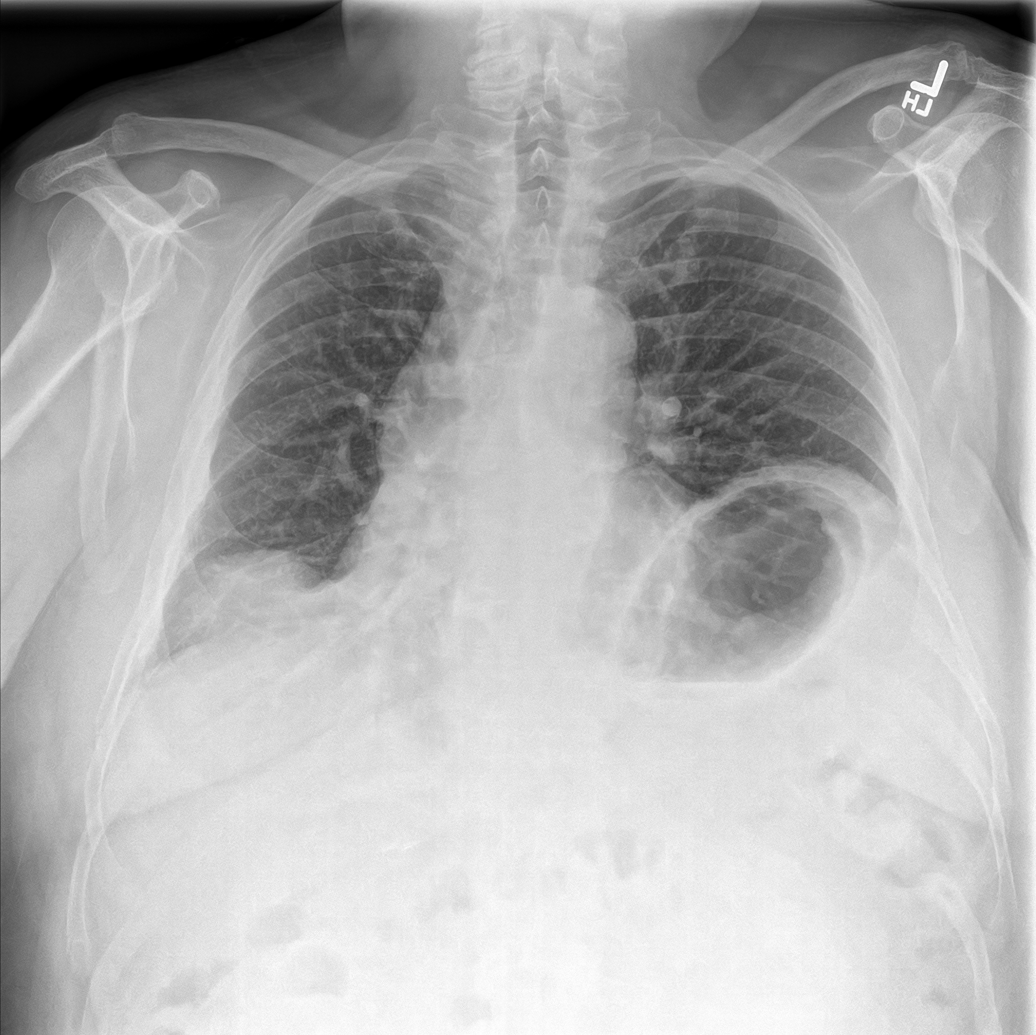
[im 2/5]
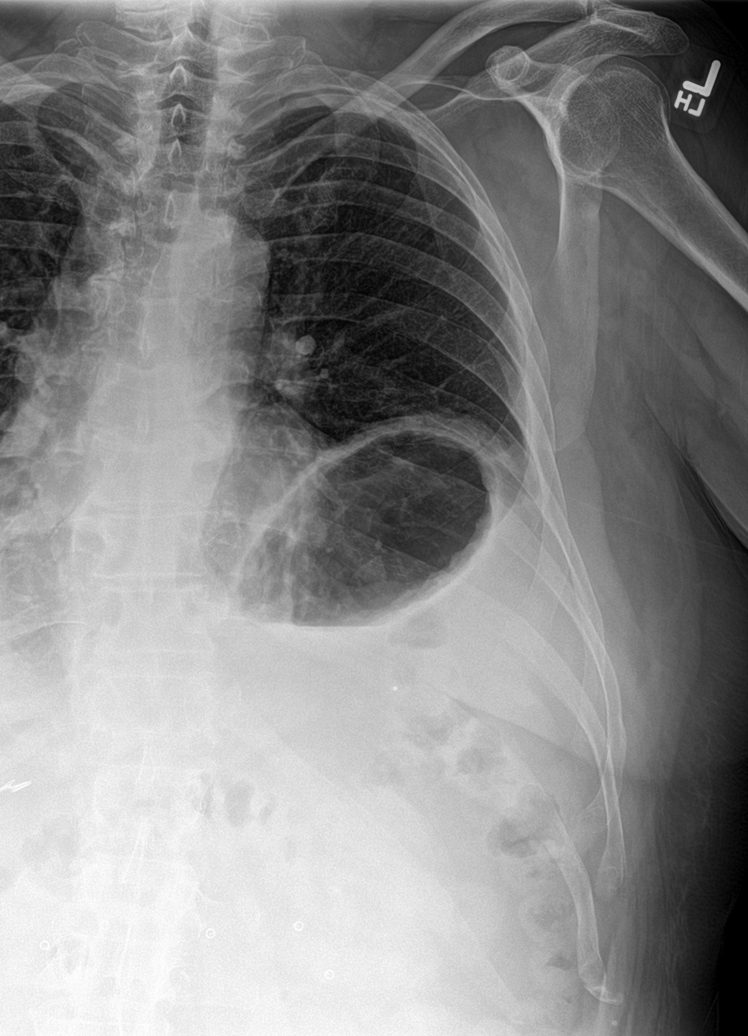
[im 3/5]
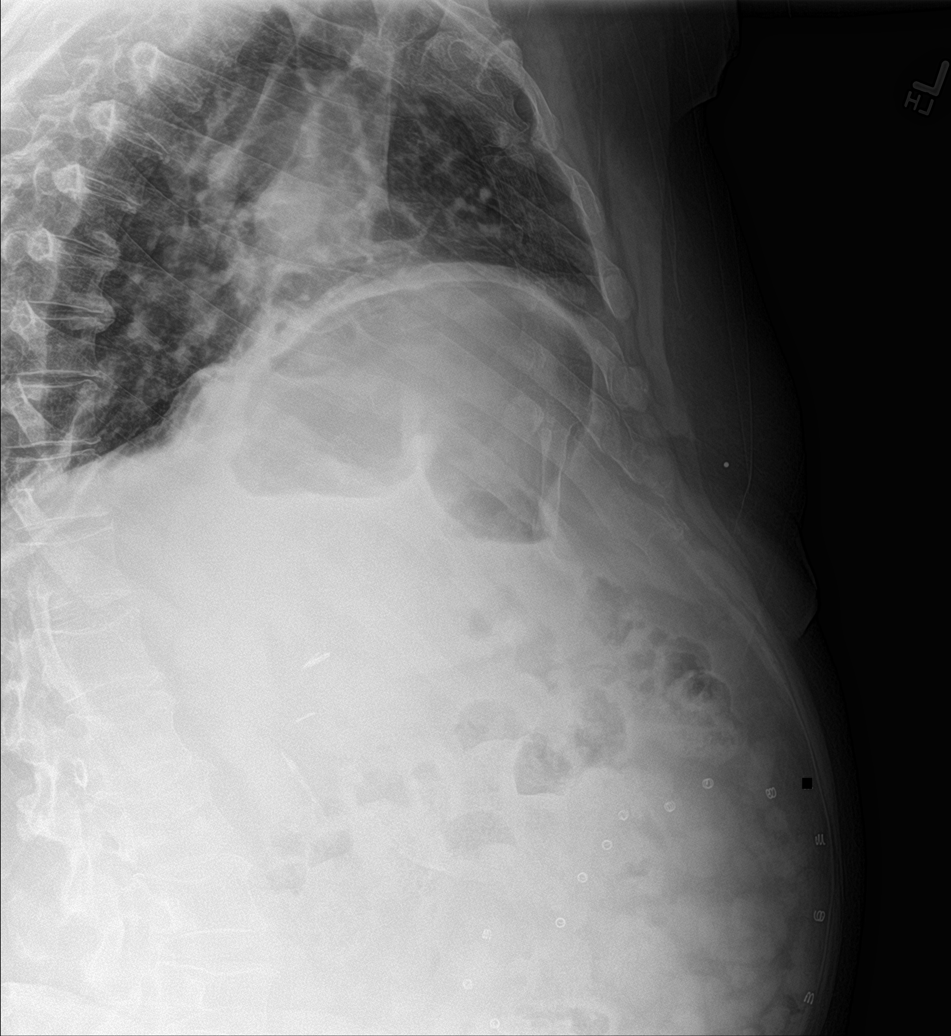
[im 4/5]
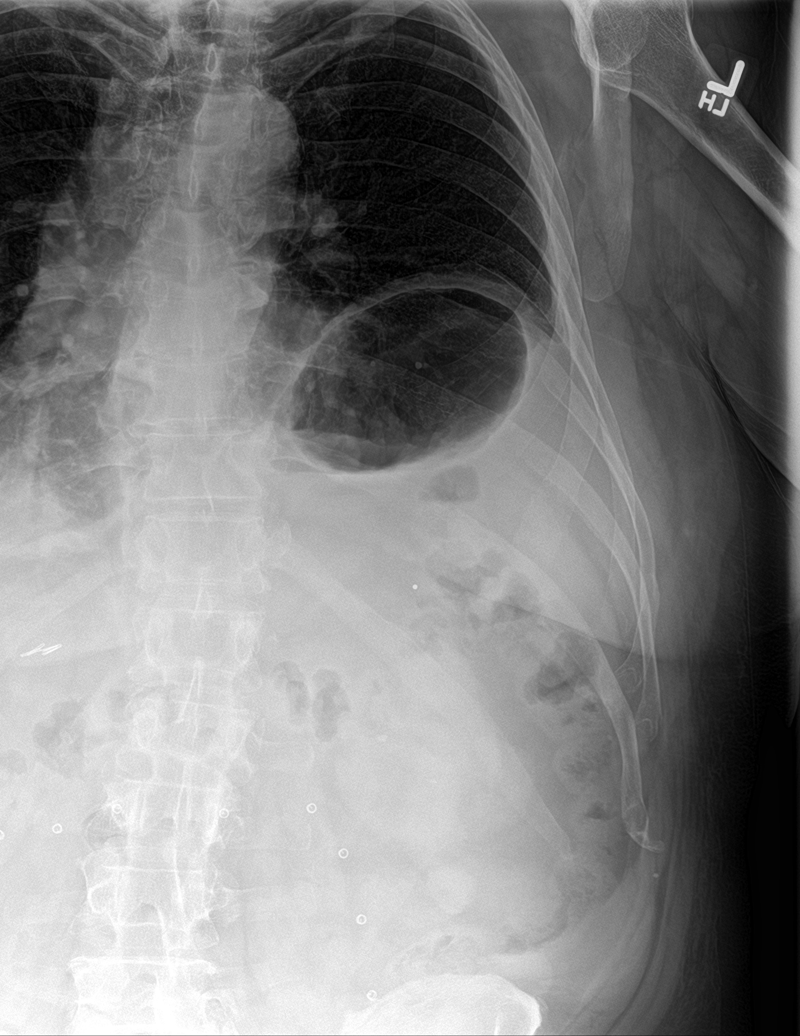
[im 5/5]
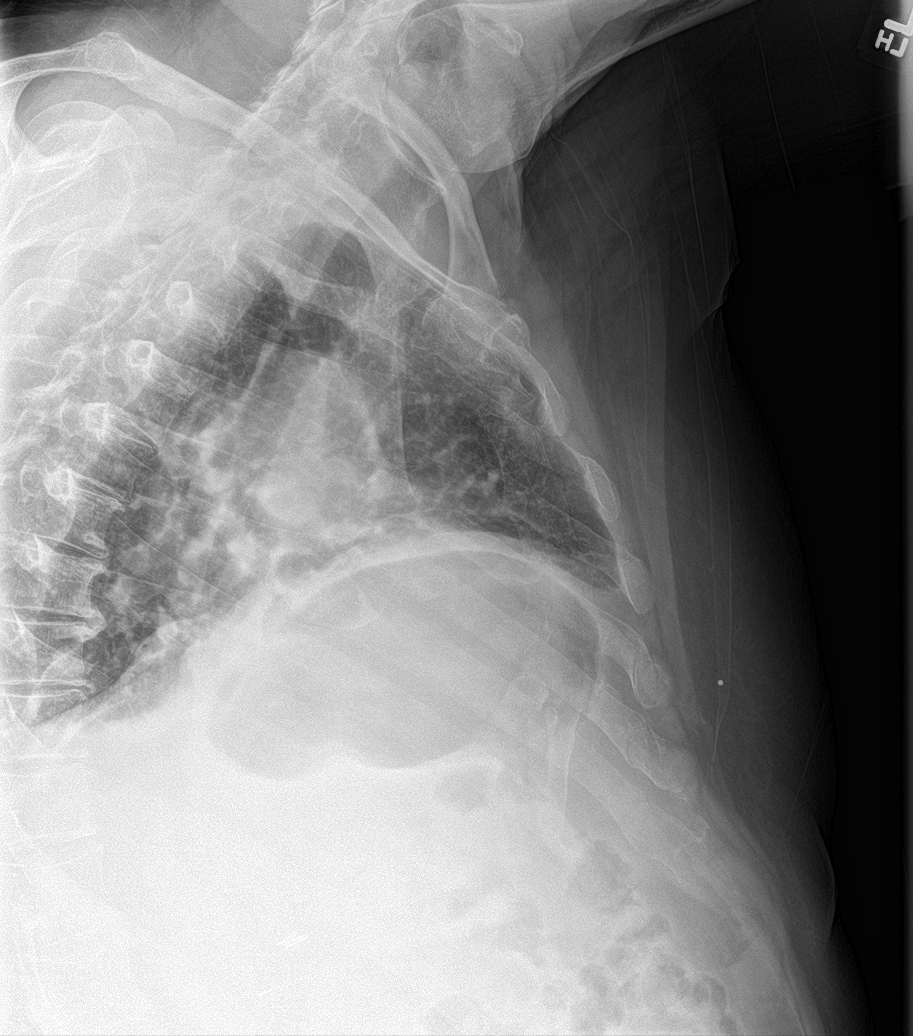

[5 of 5 positions shown; findings below may reference images not displayed]

FINDINGS: The cardiomediastinal silhouette is normal. No pneumothorax. Mild
deformity of the anterolateral ninth rib. Step-off in the anterior
left tenth rib. Possible fracture through the anterior left eighth
rib. No other acute abnormalities.
IMPRESSION: Probable subtle fractures through the anterior eighth, ninth, and
tenth ribs on the left. No pneumothorax.

## 2022-07-01 IMAGING — CT CT CHEST W/O CM
2 of 4 series · 15 of 36 positions shown, 18 images · non-contrast
Comparison: X-ray 06/18/2021, CT 08/12/2007

CLINICAL DATA: Mechanical fall 2 days ago with left rib pain



[Series 2: chest wo · axial · 0.78mm/px · z∈[-869,-535]mm · 12 of 199 slices shown, 15 images]
[im 16/199  mediastinal]
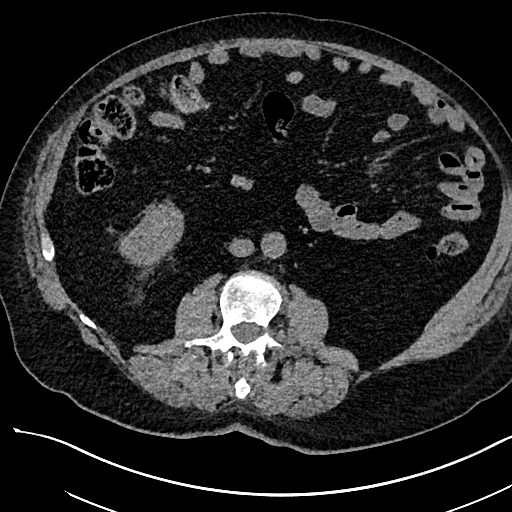
[im 16/199  lung]
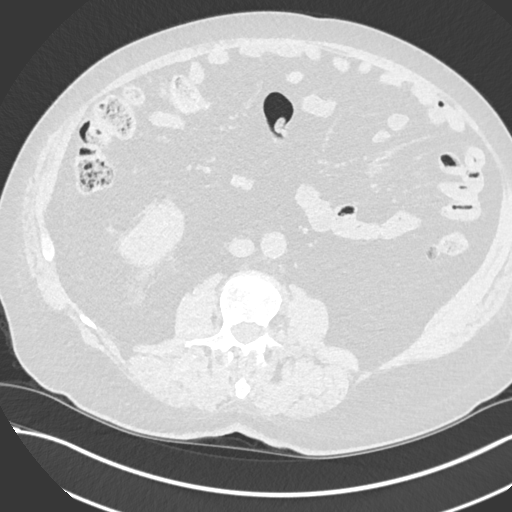
[im 31/199  lung]
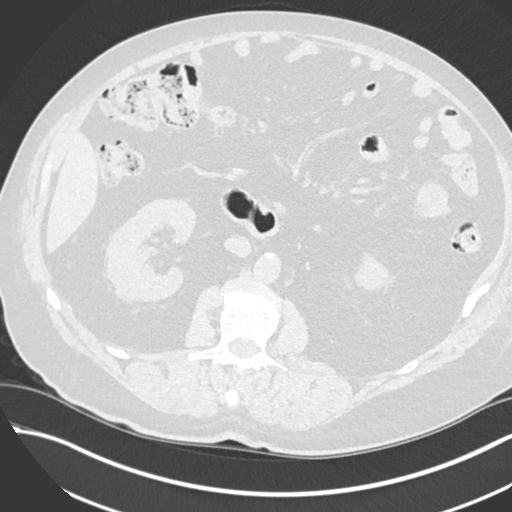
[im 46/199  lung]
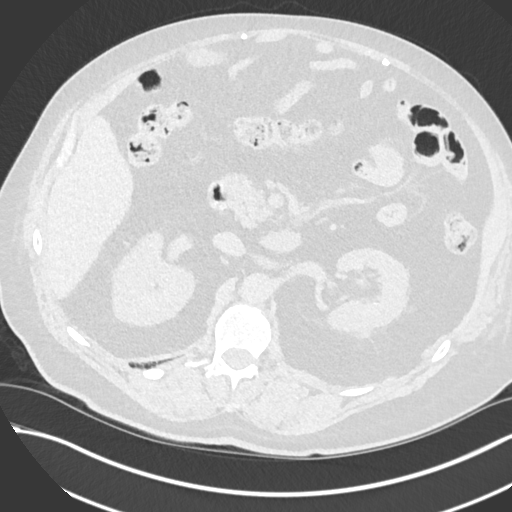
[im 61/199  lung]
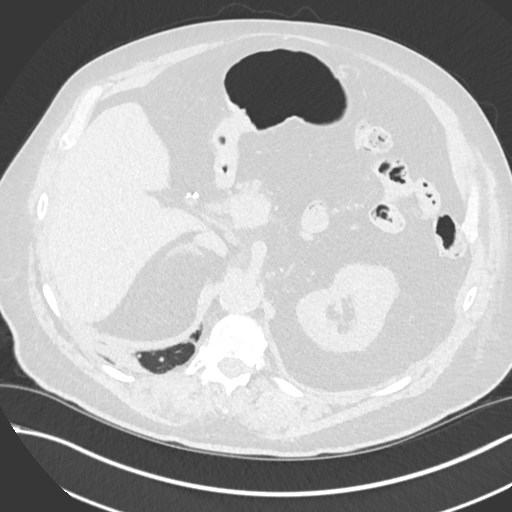
[im 77/199  mediastinal]
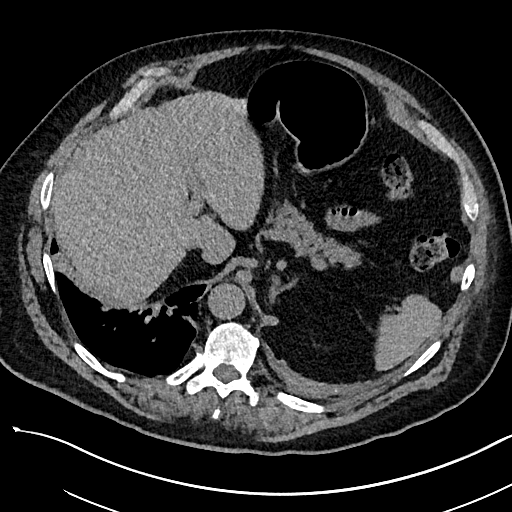
[im 77/199  lung]
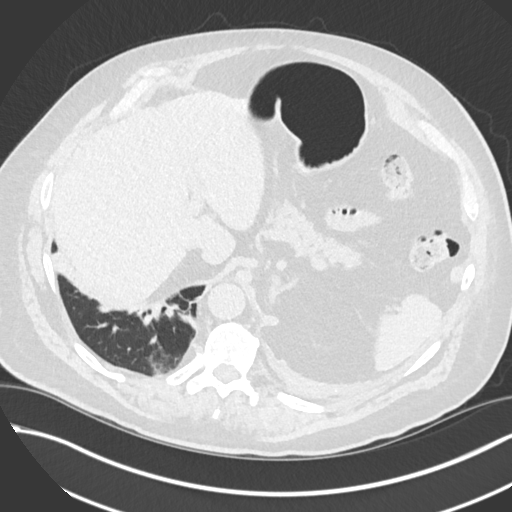
[im 92/199  lung]
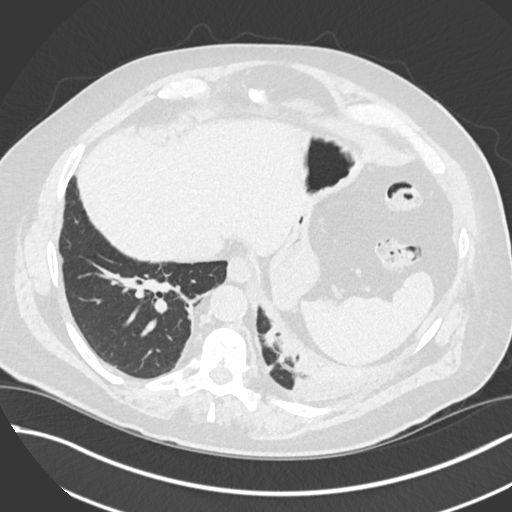
[im 107/199  lung]
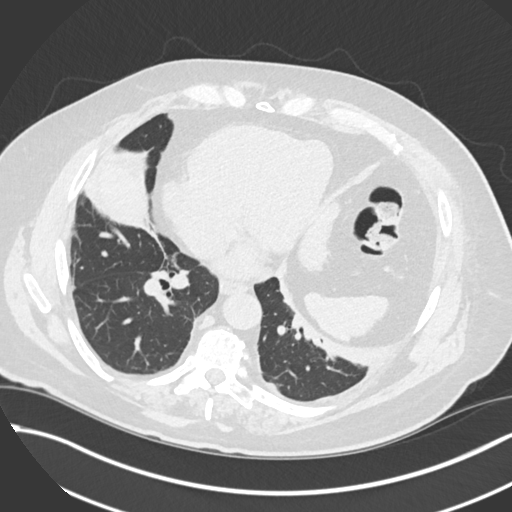
[im 122/199  lung]
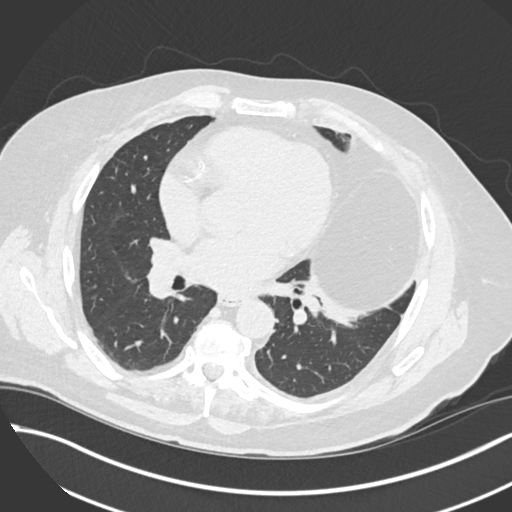
[im 138/199  mediastinal]
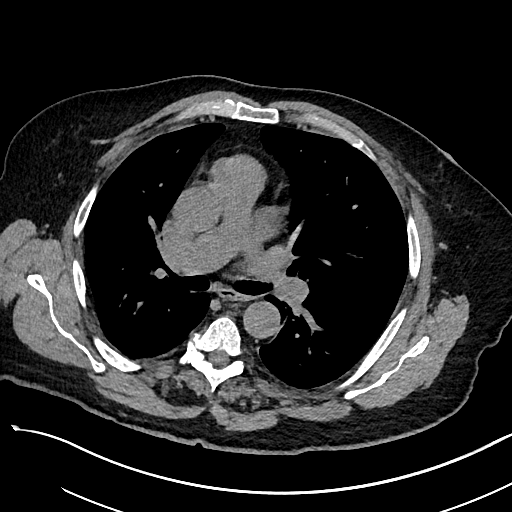
[im 138/199  lung]
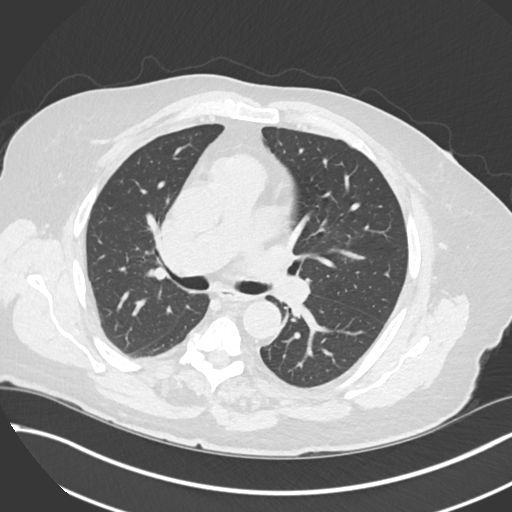
[im 153/199  lung]
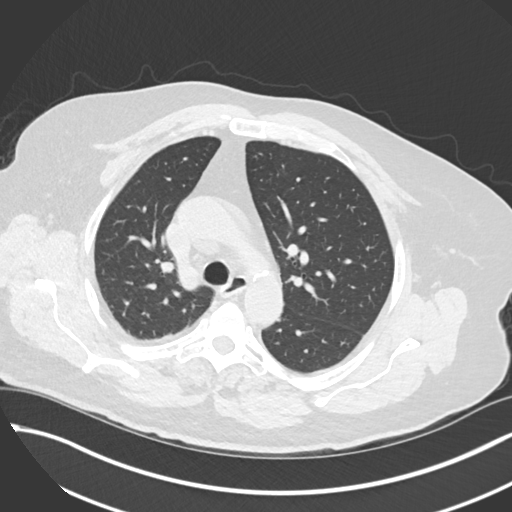
[im 168/199  lung]
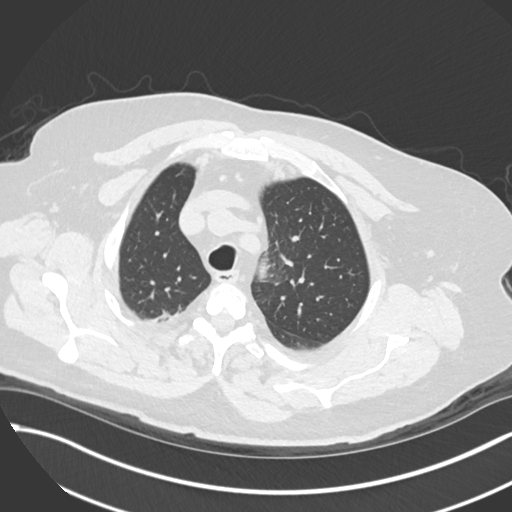
[im 183/199  lung]
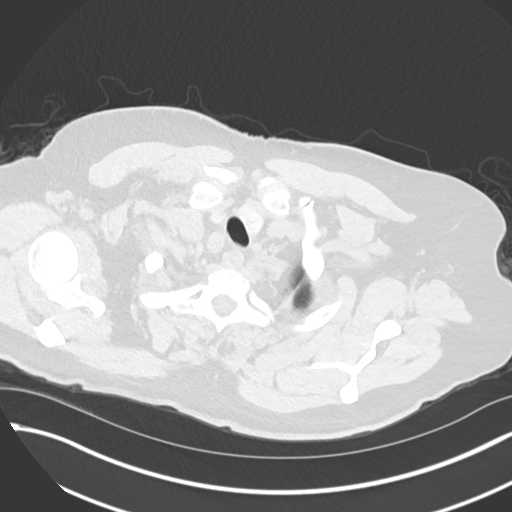

[Series 5: cor · coronal · 0.81mm/px · 3 of 174 slices shown]
[im 35/174  lung]
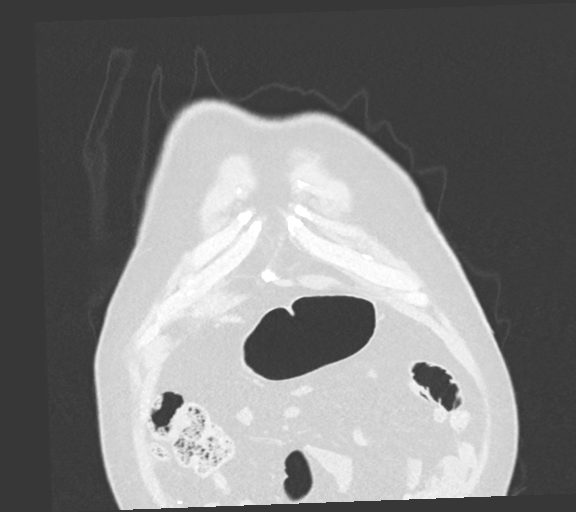
[im 70/174  lung]
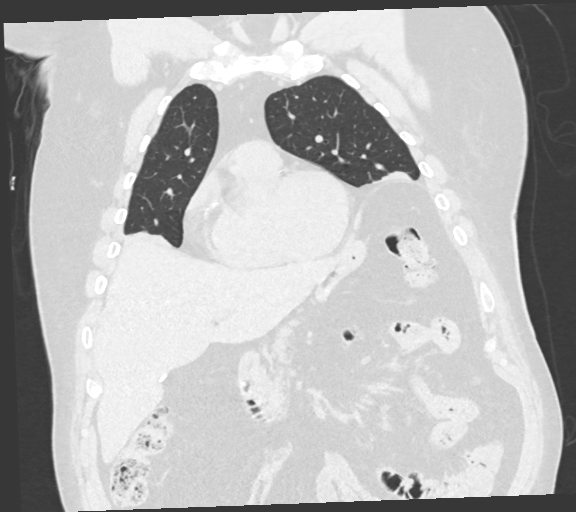
[im 104/174  lung]
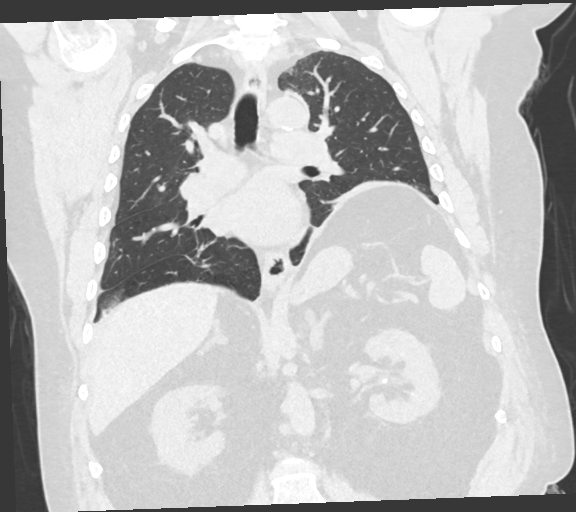

[15 of 36 positions shown; findings below may reference images not displayed]

FINDINGS: Cardiovascular: Heart size within normal limits. No pericardial
effusion. Thoracic aorta is nonaneurysmal. Scattered atherosclerotic
calcifications of the aorta and coronary arteries. Central pulmonary
vasculature is mildly dilated.

Mediastinum/Nodes: No enlarged mediastinal or axillary lymph nodes.
Thyroid gland, trachea, and esophagus demonstrate no significant
findings.

Lungs/Pleura: Elevation of the left hemidiaphragm with associated
left basilar and lingular atelectasis. Band-like atelectasis in the
right lower lobe. No evidence of pulmonary contusion or laceration.
No pleural effusion or pneumothorax.

Upper Abdomen: Bilateral nonobstructing nephrolithiasis. No acute
findings within the included upper abdomen.

Musculoskeletal: Acute nondisplaced fractures of the anterior left
sixth and seventh ribs. Thoracic vertebral body heights and
alignment are maintained. No additional fractures. Mild
posttraumatic changes within the soft tissues of the left chest wall
overlying rib fracture sites. No chest wall hematoma.
IMPRESSION: 1. Acute nondisplaced fractures of the anterior left sixth and
seventh ribs.
2. No pneumothorax.
3. Chronic elevation of the left hemidiaphragm with associated left
basilar and lingular atelectasis.
4. Bilateral nonobstructing nephrolithiasis.
5. Aortic and coronary artery atherosclerosis ([id number]).

## 2023-01-25 DIAGNOSIS — K640 First degree hemorrhoids: Secondary | ICD-10-CM | POA: Diagnosis not present

## 2023-01-25 DIAGNOSIS — K5730 Diverticulosis of large intestine without perforation or abscess without bleeding: Secondary | ICD-10-CM

## 2023-01-25 DIAGNOSIS — Z1211 Encounter for screening for malignant neoplasm of colon: Secondary | ICD-10-CM | POA: Diagnosis present
